# Patient Record
Sex: Female | Born: 1985
Health system: Southern US, Community
[De-identification: ages and names within clinical notes are randomized; demographics above are authoritative.]

## PROBLEM LIST (undated history)

## (undated) DIAGNOSIS — N390 Urinary tract infection, site not specified: Secondary | ICD-10-CM

## (undated) DIAGNOSIS — Z9889 Other specified postprocedural states: Secondary | ICD-10-CM

## (undated) DIAGNOSIS — U071 COVID-19: Secondary | ICD-10-CM

## (undated) DIAGNOSIS — A0472 Enterocolitis due to Clostridium difficile, not specified as recurrent: Secondary | ICD-10-CM

## (undated) DIAGNOSIS — W57XXXA Bitten or stung by nonvenomous insect and other nonvenomous arthropods, initial encounter: Secondary | ICD-10-CM

## (undated) DIAGNOSIS — M1990 Unspecified osteoarthritis, unspecified site: Secondary | ICD-10-CM

## (undated) DIAGNOSIS — T7840XA Allergy, unspecified, initial encounter: Secondary | ICD-10-CM

## (undated) DIAGNOSIS — J45909 Unspecified asthma, uncomplicated: Secondary | ICD-10-CM

## (undated) DIAGNOSIS — S70262A Insect bite (nonvenomous), left hip, initial encounter: Secondary | ICD-10-CM

## (undated) DIAGNOSIS — K219 Gastro-esophageal reflux disease without esophagitis: Secondary | ICD-10-CM

## (undated) HISTORY — PX: LEEP: SHX91

## (undated) HISTORY — PX: COLPOSCOPY: SHX161

## (undated) HISTORY — DX: Bitten or stung by nonvenomous insect and other nonvenomous arthropods, initial encounter: W57.XXXA

## (undated) HISTORY — DX: Urinary tract infection, site not specified: N39.0

## (undated) HISTORY — DX: Unspecified osteoarthritis, unspecified site: M19.90

## (undated) HISTORY — DX: Other specified postprocedural states: Z98.890

## (undated) HISTORY — DX: Enterocolitis due to Clostridium difficile, not specified as recurrent: A04.72

## (undated) HISTORY — DX: Gastro-esophageal reflux disease without esophagitis: K21.9

## (undated) HISTORY — PX: RETAINED PLACENTA REMOVAL: SHX2336

## (undated) HISTORY — PX: NO PAST SURGERIES: SHX2092

## (undated) HISTORY — DX: Allergy, unspecified, initial encounter: T78.40XA

## (undated) HISTORY — DX: Bitten or stung by nonvenomous insect and other nonvenomous arthropods, initial encounter: S70.262A

---

## 2011-12-23 ENCOUNTER — Ambulatory Visit: Payer: Self-pay

## 2013-09-22 ENCOUNTER — Inpatient Hospital Stay: Payer: Self-pay

## 2013-09-22 LAB — CBC WITH DIFFERENTIAL/PLATELET
Basophil %: 0.2 %
Eosinophil #: 0.1 10*3/uL (ref 0.0–0.7)
HCT: 33.7 % — ABNORMAL LOW (ref 35.0–47.0)
HGB: 11.5 g/dL — ABNORMAL LOW (ref 12.0–16.0)
Lymphocyte #: 1.5 10*3/uL (ref 1.0–3.6)
Lymphocyte %: 13.3 %
MCH: 28.7 pg (ref 26.0–34.0)
MCHC: 34.2 g/dL (ref 32.0–36.0)
MCV: 84 fL (ref 80–100)
Monocyte %: 6.8 %
Neutrophil #: 8.7 10*3/uL — ABNORMAL HIGH (ref 1.4–6.5)
Neutrophil %: 78.7 %
Platelet: 137 10*3/uL — ABNORMAL LOW (ref 150–440)
RDW: 14.2 % (ref 11.5–14.5)
WBC: 11 10*3/uL (ref 3.6–11.0)

## 2013-09-22 LAB — PLATELET COUNT: Platelet: 132 10*3/uL — ABNORMAL LOW (ref 150–440)

## 2013-11-12 ENCOUNTER — Emergency Department: Payer: Self-pay | Admitting: Emergency Medicine

## 2013-11-12 LAB — CBC
HCT: 36.8 %
HGB: 12.4 g/dL
MCH: 27.8 pg
MCHC: 33.7 g/dL
MCV: 83 fL
Platelet: 209 x10 3/mm 3
RBC: 4.45 X10 6/mm 3
RDW: 14 %
WBC: 8.8 x10 3/mm 3

## 2013-11-12 LAB — HCG, QUANTITATIVE, PREGNANCY: Beta Hcg, Quant.: 25 m[IU]/mL — ABNORMAL HIGH

## 2014-10-14 NOTE — L&D Delivery Note (Signed)
Delivery Note At 11:11 AM a viable female was delivered via Vaginal, Spontaneous Delivery (Presentation: compound; LOA followed by Left hand).  APGAR: 6, 8; weight 6 lb 14.1 oz (3120 g).   Placenta status: Intact, Spontaneous.  Cord: 3 vessels with the following complications: none.  Cord was very thick.  Cord pH: arterial: 7.29, PCO2 48, Bicarb; 23.1, BE: -3.8; Venous 7.38, PCO2: 40, Bicarb 23.7, BE- 1.3.  Anesthesia: Epidural  Episiotomy:  None  Lacerations:  clitoral Suture Repair: 3.0 vicryl Est. Blood Loss (mL):  400cc  Mom to postpartum.  Baby to Couplet care / Skin to Skin.  Carolyn Robles presented in labor at 5cm and quickly progressed. She received an epidural but had a predominant right-sided block.  She was AROM'd for clear fluid at 9cm.  Once complete she had a fairly short second stage with great maternal effort.  Baby Carolyn Robles was born and delayed cord clamping was performed due to prematurity.  For 60 seconds, the baby was held by me on the bed below the level of the placenta and maternal heart.  Bulb suction was performed to the oropharynx, and baby had good tone but remained blue.  At the 1 minute mark he began to cry.  He was placed on mom's chest and attended to by the pediatric team.  They requested the cord be cut and baby brought to the warmer for further attention.  Placenta was delivered with massage and intact - maternal surfaces and membranes were inspected carefully as patient had history of retained placenta.  The vagina was inspected and a hemrrhagic laceration at the anterior vaginal apex was identified.  Due to the extent of the bleeding, a repair was necessary.  A rubber catheter was placed in the urethra, and 3-0 Vicryl was used in a running stitch with care for depth and distance due to clitoral nerve ending preservation.  Hemostasis was acheived, and the patient was recovered.  During the repair, the newborn was having difficulty with respirations and it was decided to bring him  to the nursery for closer observation.   Upon completion of patient care, I went to the nursery to check on Carolyn Robles, who was at 93-97% O2 saturation without oxygen supplementation.  Because we were not able to sing in mom's room, I sang Happy Rudene Anda to little man in the nursery.  He clearly loved it.    ----- Larey Days, MD Attending Obstetrician and Gynecologist [facility]

## 2015-02-21 NOTE — H&P (Signed)
L&D Evaluation:  History:  HPI 29yo MWF presents with SROM of clear fluid at home at 2150 on 09/21/13; G1P0000, [redacted]w[redacted]d, irreg u/c's on admission.   Presents with leaking fluid   Patient's Medical History Asthma   Patient's Surgical History none   Medications Pre [place] Vitamins  Flonase; ProAir, xyzal   Allergies NKDA   Social History none   Family History Non-Contributory   ROS:  ROS All systems were reviewed.  HEENT, CNS, GI, GU, Respiratory, CV, Renal and Musculoskeletal systems were found to be normal.   Exam:  Vital Signs stable   Urine Protein 1+   General no apparent distress   Mental Status clear   Chest clear   Heart normal sinus rhythm   Abdomen gravid, tender with contractions   Estimated Fetal Weight Average for gestational age   Fetal Position vtx   Edema 1+   Reflexes 1+   Clonus negative   Pelvic 5/80/-1   Mebranes Ruptured   Description clear   FHT normal rate with no decels   Fetal Heart Rate 150   Ucx irregular   Ucx Frequency 6 min   Length of each Contraction 50 seconds   Ucx Pain Scale 6   Impression:  Impression active labor   Plan:  Plan monitor contractions and for cervical change, up ad lib   Electronic Signatures: Trudee Kuster, Izekiel Flegel N (CNM)  (Signed 10-Dec-14 08:13)  Authored: L&D Evaluation   Last Updated: 10-Dec-14 08:13 by Evonnie Pat (CNM)

## 2015-03-03 LAB — OB RESULTS CONSOLE HEPATITIS B SURFACE ANTIGEN: Hepatitis B Surface Ag: NEGATIVE

## 2015-03-03 LAB — OB RESULTS CONSOLE RUBELLA ANTIBODY, IGM: RUBELLA: IMMUNE

## 2015-03-03 LAB — OB RESULTS CONSOLE RPR: RPR: NONREACTIVE

## 2015-03-03 LAB — OB RESULTS CONSOLE ABO/RH: RH TYPE: POSITIVE

## 2015-03-03 LAB — OB RESULTS CONSOLE ANTIBODY SCREEN: Antibody Screen: NEGATIVE

## 2015-03-03 LAB — OB RESULTS CONSOLE VARICELLA ZOSTER ANTIBODY, IGG: Varicella: IMMUNE

## 2015-03-03 LAB — OB RESULTS CONSOLE HIV ANTIBODY (ROUTINE TESTING): HIV: NONREACTIVE

## 2015-10-07 ENCOUNTER — Inpatient Hospital Stay: Payer: Commercial Managed Care - HMO | Admitting: Anesthesiology

## 2015-10-07 ENCOUNTER — Inpatient Hospital Stay
Admission: EM | Admit: 2015-10-07 | Discharge: 2015-10-09 | DRG: 775 | Disposition: A | Discharge status: Home / Self Care | Payer: Commercial Managed Care - HMO | Attending: Obstetrics & Gynecology | Admitting: Obstetrics & Gynecology

## 2015-10-07 DIAGNOSIS — O9952 Diseases of the respiratory system complicating childbirth: Secondary | ICD-10-CM | POA: Diagnosis present

## 2015-10-07 DIAGNOSIS — J45909 Unspecified asthma, uncomplicated: Secondary | ICD-10-CM

## 2015-10-07 DIAGNOSIS — J4520 Mild intermittent asthma, uncomplicated: Secondary | ICD-10-CM | POA: Diagnosis present

## 2015-10-07 DIAGNOSIS — O99519 Diseases of the respiratory system complicating pregnancy, unspecified trimester: Secondary | ICD-10-CM

## 2015-10-07 DIAGNOSIS — Z3A35 35 weeks gestation of pregnancy: Secondary | ICD-10-CM

## 2015-10-07 DIAGNOSIS — O09299 Supervision of pregnancy with other poor reproductive or obstetric history, unspecified trimester: Secondary | ICD-10-CM

## 2015-10-07 DIAGNOSIS — O0993 Supervision of high risk pregnancy, unspecified, third trimester: Secondary | ICD-10-CM

## 2015-10-07 HISTORY — DX: Unspecified asthma, uncomplicated: J45.909

## 2015-10-07 LAB — CBC
HEMATOCRIT: 34.6 % — AB (ref 35.0–47.0)
HEMOGLOBIN: 11.8 g/dL — AB (ref 12.0–16.0)
MCH: 26.7 pg (ref 26.0–34.0)
MCHC: 34.2 g/dL (ref 32.0–36.0)
MCV: 78 fL — AB (ref 80.0–100.0)
Platelets: 154 10*3/uL (ref 150–440)
RBC: 4.44 MIL/uL (ref 3.80–5.20)
RDW: 13.9 % (ref 11.5–14.5)
WBC: 10 10*3/uL (ref 3.6–11.0)

## 2015-10-07 LAB — TYPE AND SCREEN
ABO/RH(D): A POS
Antibody Screen: NEGATIVE

## 2015-10-07 LAB — ABO/RH: ABO/RH(D): A POS

## 2015-10-07 MED ORDER — BUPIVACAINE HCL (PF) 0.25 % IJ SOLN
INTRAMUSCULAR | Status: DC | PRN
  Administered 2015-10-07: 5 mL via EPIDURAL

## 2015-10-07 MED ORDER — IBUPROFEN 600 MG PO TABS
600.0000 mg | ORAL_TABLET | Freq: Four times a day (QID) | ORAL | Status: DC
  Administered 2015-10-07: 600 mg via ORAL
  Filled 2015-10-07 (×2): qty 1

## 2015-10-07 MED ORDER — LANOLIN HYDROUS EX OINT: TOPICAL_OINTMENT | CUTANEOUS | Status: DC | PRN

## 2015-10-07 MED ORDER — OXYTOCIN 10 UNIT/ML IJ SOLN
INTRAMUSCULAR | Status: AC
  Filled 2015-10-07: qty 2

## 2015-10-07 MED ORDER — LIDOCAINE HCL (PF) 1 % IJ SOLN: 30.0000 mL | INTRAMUSCULAR | Status: DC | PRN

## 2015-10-07 MED ORDER — ACETAMINOPHEN 325 MG PO TABS: 650.0000 mg | ORAL_TABLET | ORAL | Status: DC | PRN

## 2015-10-07 MED ORDER — DIPHENHYDRAMINE HCL 25 MG PO CAPS: 25.0000 mg | ORAL_CAPSULE | Freq: Four times a day (QID) | ORAL | Status: DC | PRN

## 2015-10-07 MED ORDER — AMMONIA AROMATIC IN INHA
RESPIRATORY_TRACT | Status: AC
  Filled 2015-10-07: qty 10

## 2015-10-07 MED ORDER — FENTANYL 2.5 MCG/ML W/ROPIVACAINE 0.2% IN NS 100 ML EPIDURAL INFUSION (ARMC-ANES)
EPIDURAL | Status: AC
  Administered 2015-10-07: 12 mL/h via EPIDURAL
  Filled 2015-10-07: qty 100

## 2015-10-07 MED ORDER — WITCH HAZEL-GLYCERIN EX PADS: 1.0000 "application " | MEDICATED_PAD | CUTANEOUS | Status: DC | PRN

## 2015-10-07 MED ORDER — TERBUTALINE SULFATE 1 MG/ML IJ SOLN
INTRAMUSCULAR | Status: AC
Start: 2015-10-07 — End: 2015-10-07
  Filled 2015-10-07: qty 1

## 2015-10-07 MED ORDER — MISOPROSTOL 200 MCG PO TABS
ORAL_TABLET | ORAL | Status: AC
  Filled 2015-10-07: qty 4

## 2015-10-07 MED ORDER — AMPICILLIN SODIUM 2 G IJ SOLR
INTRAMUSCULAR | Status: AC
  Administered 2015-10-07: 2 g via INTRAVENOUS
  Filled 2015-10-07: qty 2000

## 2015-10-07 MED ORDER — ONDANSETRON HCL 4 MG PO TABS
4.0000 mg | ORAL_TABLET | ORAL | Status: DC | PRN
Start: 2015-10-07 — End: 2015-10-09

## 2015-10-07 MED ORDER — HYDROCORTISONE ACE-PRAMOXINE 1-1 % RE FOAM
1.0000 | Freq: Two times a day (BID) | RECTAL | Status: DC
  Administered 2015-10-08: 1 via RECTAL
  Filled 2015-10-07: qty 10

## 2015-10-07 MED ORDER — EPHEDRINE 5 MG/ML INJ: 10.0000 mg | INTRAVENOUS | Status: DC | PRN

## 2015-10-07 MED ORDER — BENZOCAINE-MENTHOL 20-0.5 % EX AERO
1.0000 "application " | INHALATION_SPRAY | CUTANEOUS | Status: DC | PRN
  Filled 2015-10-07: qty 56

## 2015-10-07 MED ORDER — FENTANYL 2.5 MCG/ML W/ROPIVACAINE 0.2% IN NS 100 ML EPIDURAL INFUSION (ARMC-ANES): 12.0000 mL/h | EPIDURAL | Status: DC

## 2015-10-07 MED ORDER — ONDANSETRON HCL 4 MG/2ML IJ SOLN: 4.0000 mg | Freq: Four times a day (QID) | INTRAMUSCULAR | Status: DC | PRN

## 2015-10-07 MED ORDER — OXYTOCIN 40 UNITS IN LACTATED RINGERS INFUSION - SIMPLE MED
62.5000 mL/h | INTRAVENOUS | Status: DC
  Administered 2015-10-07: 62.5 mL/h via INTRAVENOUS

## 2015-10-07 MED ORDER — LIDOCAINE HCL (PF) 1 % IJ SOLN
INTRAMUSCULAR | Status: AC
  Administered 2015-10-07: 2 mL
  Filled 2015-10-07: qty 30

## 2015-10-07 MED ORDER — TETANUS-DIPHTH-ACELL PERTUSSIS 5-2.5-18.5 LF-MCG/0.5 IM SUSP
0.5000 mL | Freq: Once | INTRAMUSCULAR | Status: DC
Start: 2015-10-08 — End: 2015-10-09

## 2015-10-07 MED ORDER — SODIUM CHLORIDE 0.9 % IV SOLN
2.0000 g | Freq: Once | INTRAVENOUS | Status: AC
  Administered 2015-10-07: 2 g via INTRAVENOUS

## 2015-10-07 MED ORDER — BETAMETHASONE SOD PHOS & ACET 6 (3-3) MG/ML IJ SUSP
INTRAMUSCULAR | Status: AC
  Filled 2015-10-07: qty 1

## 2015-10-07 MED ORDER — PHENYLEPHRINE 40 MCG/ML (10ML) SYRINGE FOR IV PUSH (FOR BLOOD PRESSURE SUPPORT): 80.0000 ug | PREFILLED_SYRINGE | INTRAVENOUS | Status: DC | PRN

## 2015-10-07 MED ORDER — SIMETHICONE 80 MG PO CHEW: 80.0000 mg | CHEWABLE_TABLET | ORAL | Status: DC | PRN

## 2015-10-07 MED ORDER — DIPHENHYDRAMINE HCL 50 MG/ML IJ SOLN: 12.5000 mg | INTRAMUSCULAR | Status: DC | PRN

## 2015-10-07 MED ORDER — DIBUCAINE 1 % RE OINT: 1.0000 "application " | TOPICAL_OINTMENT | RECTAL | Status: DC | PRN

## 2015-10-07 MED ORDER — LACTATED RINGERS IV SOLN
INTRAVENOUS | Status: DC
  Administered 2015-10-07: 09:00:00 via INTRAVENOUS

## 2015-10-07 MED ORDER — LACTATED RINGERS IV SOLN: 500.0000 mL | INTRAVENOUS | Status: DC | PRN

## 2015-10-07 MED ORDER — OXYCODONE-ACETAMINOPHEN 5-325 MG PO TABS
1.0000 | ORAL_TABLET | ORAL | Status: DC | PRN
  Filled 2015-10-07: qty 1

## 2015-10-07 MED ORDER — ONDANSETRON HCL 4 MG/2ML IJ SOLN: 4.0000 mg | INTRAMUSCULAR | Status: DC | PRN

## 2015-10-07 MED ORDER — OXYTOCIN 40 UNITS IN LACTATED RINGERS INFUSION - SIMPLE MED
INTRAVENOUS | Status: AC
  Administered 2015-10-07: 62.5 mL/h via INTRAVENOUS
  Filled 2015-10-07: qty 1000

## 2015-10-07 MED ORDER — PRENATAL MULTIVITAMIN CH
1.0000 | ORAL_TABLET | Freq: Every day | ORAL | Status: DC
  Administered 2015-10-08: 1 via ORAL
  Filled 2015-10-07: qty 1

## 2015-10-07 MED ORDER — OXYTOCIN BOLUS FROM INFUSION
500.0000 mL | INTRAVENOUS | Status: DC
  Administered 2015-10-07: 500 mL via INTRAVENOUS

## 2015-10-07 MED ORDER — LIDOCAINE-EPINEPHRINE (PF) 1.5 %-1:200000 IJ SOLN
INTRAMUSCULAR | Status: DC | PRN
  Administered 2015-10-07: 2 mL via EPIDURAL

## 2015-10-07 MED ORDER — OXYTOCIN 40 UNITS IN LACTATED RINGERS INFUSION - SIMPLE MED
62.5000 mL/h | INTRAVENOUS | Status: DC | PRN
  Filled 2015-10-07: qty 1000

## 2015-10-07 MED ORDER — DOCUSATE SODIUM 100 MG PO CAPS
100.0000 mg | ORAL_CAPSULE | Freq: Two times a day (BID) | ORAL | Status: DC
  Administered 2015-10-07: 100 mg via ORAL
  Filled 2015-10-07 (×2): qty 1

## 2015-10-07 NOTE — Anesthesia Procedure Notes (Signed)
Epidural Patient location during procedure: OB Start time: 10/07/2015 10:06 AM End time: 10/07/2015 10:20 AM  Staffing Anesthesiologist: Marline Backbone F Performed by: anesthesiologist   Preanesthetic Checklist Completed: patient identified, site marked, surgical consent, pre-op evaluation, timeout performed, IV checked, risks and benefits discussed, monitors and equipment checked and at surgeon's request  Epidural Patient position: sitting Prep: Betadine Patient monitoring: heart rate and blood pressure Approach: midline Location: L3-L4 Injection technique: LOR air and LOR saline  Needle:  Needle type: Tuohy  Needle gauge: 18 G Needle length: 9 cm Needle insertion depth: 5 cm Catheter type: closed end flexible Catheter size: 20 Guage Catheter at skin depth: 10 cm Test dose: negative and 1.5% lidocaine with Epi 1:200 K  Assessment Sensory level: T8  Additional Notes Reason for block:at surgeon's request

## 2015-10-07 NOTE — H&P (Signed)
OB History & Physical   History of Present Illness:  Chief Complaint:   HPI:  Carolyn Robles is a 29 y.o. G2P1001 female at [redacted]w[redacted]d dated by LMP c/w 1st trimester scan with EDC of 11/05/15.  She presents to L&D for persistent contractions overnight.  +FM, + CTX, no LOF, no VB  Pregnancy Issues: 1. History of pre-eclampsia 2. Asthma, well-controlled  Maternal Medical History:   Past Medical History  Diagnosis Date  . Asthma     seasonal    Past Surgical History  Procedure Laterality Date  . No past surgeries      No Known Allergies  Prior to Admission medications   Medication Sig Start Date End Date Taking? Authorizing Provider  albuterol (PROVENTIL HFA;VENTOLIN HFA) 108 (90 BASE) MCG/ACT inhaler Inhale into the lungs every 6 (six) hours as needed for wheezing or shortness of breath.   Yes Historical Provider, MD  levocetirizine (XYZAL) 5 MG tablet Take 5 mg by mouth every evening.   Yes Historical Provider, MD  Prenatal Vit-Fe Fumarate-FA (PRENATAL MULTIVITAMIN) TABS tablet Take 1 tablet by mouth daily at 12 noon.   Yes Historical Provider, MD     Prenatal care site: [facility]   Social History: She  reports that she has never smoked. She has never used smokeless tobacco. She reports that she does not drink alcohol or use illicit drugs.  Family History: family history includes Diabetes in her father; Heart disease in her mother.   Review of Systems: Negative x 10 systems reviewed except as noted in the HPI.     Physical Exam:  Vital Signs: BP 134/78 mmHg  Pulse 90  Temp(Src) 98 F (36.7 C) (Oral)  Resp 14  Ht 5\' 6"  (1.676 m)  Wt 94.348 kg (208 lb)  BMI 33.59 kg/m2  SpO2 99% General: no acute distress.  HEENT: normocephalic, atraumatic Heart: regular rate & rhythm.  No murmurs/rubs/gallops Lungs: clear to auscultation bilaterally, normal respiratory effort Abdomen: soft, gravid, non-tender;  EFW: 7.0lbs Pelvic:   External: Normal external female  genitalia  Cervix: Dilation: 5 / Effacement (%): 80 / Station: -1    Extremities: non-tender, symmetric, 2+ edema bilaterally.  DTRs: 2+ Neurologic: Alert & oriented x 3.    Results for orders placed or performed during the hospital encounter of 10/07/15 (from the past 24 hour(s))  CBC     Status: Abnormal   Collection Time: 10/07/15  9:17 AM  Result Value Ref Range   WBC 10.0 3.6 - 11.0 K/uL   RBC 4.44 3.80 - 5.20 MIL/uL   Hemoglobin 11.8 (L) 12.0 - 16.0 g/dL   HCT 34.6 (L) 35.0 - 47.0 %   MCV 78.0 (L) 80.0 - 100.0 fL   MCH 26.7 26.0 - 34.0 pg   MCHC 34.2 32.0 - 36.0 g/dL   RDW 13.9 11.5 - 14.5 %   Platelets 154 150 - 440 K/uL  Type and screen     Status: None   Collection Time: 10/07/15  9:17 AM  Result Value Ref Range   ABO/RH(D) A POS    Antibody Screen NEG    Sample Expiration 10/10/2015   ABO/Rh     Status: None   Collection Time: 10/07/15  9:21 AM  Result Value Ref Range   ABO/RH(D) A POS     Pertinent Results:  Prenatal Labs: Blood type/Rh A+  Antibody screen neg  Rubella Immune  Varicella Immune  RPR NR  HBsAg Neg  HIV NR  GC neg  Chlamydia neg  Genetic screening negative  1 hour GTT 124  3 hour GTT n/a  GBS Not done  S/p flu and tdap   FHT: 150 mod + accels no decels TOCO: q2-3 mins SVE: [id number]   Cephalic by leopolds  Assessment:  Carolyn Robles is a 30 y.o. G64P1001 female at [redacted]w[redacted]d with preterm labor.   Plan:  1. Admit to Labor & Delivery 2. CBC, T&S, Clrs, IVF 3. GBS not done, ampicillin 2g q4h 4. Consents obtained. 5. Continuous efm/toco 6. Offered betamethasone and tocolytics to patient with explanation that this has recently shown to be of benefit for preterm up to 36.[redacted] weeks gestation; discussed the administration and course, and patient declined intervention.   7. Epidural after platelets return 8. Anticipate vaginal and imminent delivery.  ----- Larey Days, MD Attending Obstetrician and Gynecologist [facility]

## 2015-10-07 NOTE — Anesthesia Preprocedure Evaluation (Signed)
Anesthesia Evaluation  Patient identified by MRN, date of birth, ID band Patient awake    Reviewed: Allergy & Precautions, NPO status , Patient's Chart, lab work & pertinent test results  Airway Mallampati: I       Dental no notable dental hx.    Pulmonary neg pulmonary ROS,    Pulmonary exam normal        Cardiovascular negative cardio ROS   Rhythm:Regular     Neuro/Psych    GI/Hepatic negative GI ROS, Neg liver ROS,   Endo/Other  negative endocrine ROS  Renal/GU negative Renal ROS     Musculoskeletal negative musculoskeletal ROS (+)   Abdominal Normal abdominal exam  (+)   Peds negative pediatric ROS (+)  Hematology negative hematology ROS (+)   Anesthesia Other Findings   Reproductive/Obstetrics                             Anesthesia Physical Anesthesia Plan  ASA: II  Anesthesia Plan: Epidural   Post-op Pain Management:    Induction:   Airway Management Planned:   Additional Equipment:   Intra-op Plan:   Post-operative Plan:   Informed Consent: I have reviewed the patients History and Physical, chart, labs and discussed the procedure including the risks, benefits and alternatives for the proposed anesthesia with the patient or authorized representative who has indicated his/her understanding and acceptance.     Plan Discussed with:   Anesthesia Plan Comments:         Anesthesia Quick Evaluation

## 2015-10-07 NOTE — Discharge Summary (Signed)
Obstetrical Discharge Summary  Patient Name: Carolyn Robles DOB: 08/01/1986 MRN: [id number]  Date of Admission: 10/07/2015 Date of Discharge: 10/09/2015 Primary OB: [facility]   Gestational Age at Delivery: [redacted]w[redacted]d   Antepartum complications: Asthma, history of preelcampsia Admitting Diagnosis: preterm labor Secondary Diagnosis: Patient Active Problem List   Diagnosis Date Noted  . Postpartum care following vaginal delivery 10/07/2015    Augmentation: none Complications: None Intrapartum complications/course: Carolyn Robles presented in labor at 5cm and quickly progressed.  Ampicillin was given for GBS PPX. She received an epidural but had a predominant right-sided block. She was AROM'd for clear fluid at 9cm. Once complete she had a fairly short second stage with great maternal effort. Baby Carolyn Robles was born and delayed cord clamping was performed due to prematurity. For 60 seconds, the baby was held by me on the bed below the level of the placenta and maternal heart. Bulb suction was performed to the oropharynx, and baby had good tone but remained blue. At the 1 minute mark he began to cry. He was placed on mom's chest and attended to by the pediatric team. They requested the cord be cut and baby brought to the warmer for further attention. Placenta was delivered with massage and intact - maternal surfaces and membranes were inspected carefully as patient had history of retained placenta. The vagina was inspected and a hemrrhagic laceration at the anterior vaginal apex was identified. Due to the extent of the bleeding, a repair was necessary. A rubber catheter was placed in the urethra, and 3-0 Vicryl was used in a running stitch with care for depth and distance due to clitoral nerve ending preservation. Hemostasis was acheived, and the patient was recovered. During the repair, the newborn was having difficulty with respirations and it was decided to bring him to the nursery for closer  observation. Upon completion of patient care, I went to the nursery to check on Carolyn Robles, who was at 93-97% O2 saturation without oxygen supplementation. Because we were not able to sing in mom's room, I sang Happy Birthday to little man in the nursery. He clearly loved it.  Date of Delivery: 10/07/15 Delivered By: Carolyn Robles Delivery Type: spontaneous vaginal delivery Anesthesia: epidural Placenta: sponatneous Laceration: clitoral Episiotomy: none Newborn Data: Live born female /Carolyn Robles Birth Weight: 6 lb 14.1 oz (3120 g) APGAR: 6, 8    Discharge Physical Exam: 10/09/2015  BP 113/75 mmHg  Pulse 65  Temp(Src) 98 F (36.7 C) (Oral)  Resp 18  Ht 5' 6" (1.676 m)  Wt 94.348 kg (208 lb)  BMI 33.59 kg/m2  SpO2 98%  General: NAD ABD: s/nd/nt, fundus firm and 2FB below the umbilicus Lochia: small DVT Evaluation: LE non-ttp, no evidence of DVT on exam.  HEMOGLOBIN  Date Value Ref Range Status  10/08/2015 10.4* 12.0 - 16.0 g/dL Final   HGB  Date Value Ref Range Status  11/12/2013 12.4 12.0-16.0 g/dL Final   HCT  Date Value Ref Range Status  10/08/2015 30.4* 35.0 - 47.0 % Final  11/12/2013 36.8 35.0-47.0 % Final    Post partum course: Postpartum course was uncomplicated except for mild asymptomatic anemia. Patient was discharged on PPD #2 having met discharge goals: tolerating regular diet, voiding without difficulties, afebrile,having appropriate lochia  and ambulating without assistance. Carolyn Robles was breastfeeding and caring for baby with minimal assistance Postpartum Procedures:none Disposition: stable, discharge to home. Baby Feeding: breastmilk Baby Disposition: home with mom  Rh Immune globulin given: n/a Rubella vaccine given: n/a Tdap vaccine given in   AP or PP setting: antepartum Flu vaccine given in AP or PP setting: antepartum  Contraception: minipill  Prenatal Labs:  Blood type/Rh A+  Antibody screen neg  Rubella Immune  Varicella Immune  RPR  NR  HBsAg Neg  HIV NR  GC neg  Chlamydia neg  Genetic screening negative  1 hour GTT 124  3 hour GTT n/a  GBS Not done  S/p flu and tdap        Plan:  Carolyn Robles was discharged to home in good condition. Follow-up appointment at [facility] with Dr Leonides Schanz in 6 weeks   Discharge Medications:   Medication List    TAKE these medications        albuterol 108 (90 BASE) MCG/ACT inhaler  Commonly known as:  PROVENTIL HFA;VENTOLIN HFA  Inhale into the lungs every 6 (six) hours as needed for wheezing or shortness of breath.     benzocaine-Menthol 20-0.5 % Aero  Commonly known as:  DERMOPLAST  Apply 1 application topically as needed for irritation (perineal discomfort).     docusate sodium 100 MG capsule  Commonly known as:  COLACE  Take 1 capsule (100 mg total) by mouth 2 (two) times daily as needed for mild constipation.     ibuprofen 600 MG tablet  Commonly known as:  ADVIL,MOTRIN  Take 1 tablet (600 mg total) by mouth every 6 (six) hours as needed.     levocetirizine 5 MG tablet  Commonly known as:  XYZAL  Take 5 mg by mouth every evening.     norethindrone 0.35 MG tablet  Commonly known as:  MICRONOR,CAMILA,ERRIN  Take 1 tablet (0.35 mg total) by mouth daily.  Start taking on:  11/03/2016     oxyCODONE-acetaminophen 5-325 MG tablet  Commonly known as:  PERCOCET/ROXICET  Take 1 tablet by mouth every 6 (six) hours as needed (for pain scale 4-7).     prenatal multivitamin Tabs tablet  Take 1 tablet by mouth daily at 12 noon.        Follow-up Information    Follow up with Robles, Honor Loh, MD In 6 weeks.   Specialty:  Obstetrics and Gynecology   Why:  for routin post partum follow-up   Contact information:   [place] [address] [phone]       Signed: Dalia Robles, CNM

## 2015-10-08 LAB — CBC
HCT: 30.4 % — ABNORMAL LOW (ref 35.0–47.0)
Hemoglobin: 10.4 g/dL — ABNORMAL LOW (ref 12.0–16.0)
MCH: 26.6 pg (ref 26.0–34.0)
MCHC: 34.1 g/dL (ref 32.0–36.0)
MCV: 78 fL — AB (ref 80.0–100.0)
PLATELETS: 145 10*3/uL — AB (ref 150–440)
RBC: 3.9 MIL/uL (ref 3.80–5.20)
RDW: 14 % (ref 11.5–14.5)
WBC: 9.8 10*3/uL (ref 3.6–11.0)

## 2015-10-08 LAB — RPR: RPR: NONREACTIVE

## 2015-10-08 MED ORDER — DOCUSATE SODIUM 100 MG PO CAPS: 100.0000 mg | ORAL_CAPSULE | Freq: Two times a day (BID) | ORAL | Status: DC | PRN

## 2015-10-08 MED ORDER — OXYCODONE-ACETAMINOPHEN 5-325 MG PO TABS: 1.0000 | ORAL_TABLET | Freq: Four times a day (QID) | ORAL | Status: DC | PRN

## 2015-10-08 MED ORDER — IBUPROFEN 600 MG PO TABS: 600.0000 mg | ORAL_TABLET | Freq: Four times a day (QID) | ORAL | Status: DC | PRN

## 2015-10-08 NOTE — Anesthesia Postprocedure Evaluation (Signed)
Anesthesia Post Note  Patient: Carolyn Robles  Procedure(s) Performed: * No procedures listed *  Patient location during evaluation: Mother Baby Anesthesia Type: Epidural Level of consciousness: awake, awake and alert and oriented Pain management: pain level controlled Vital Signs Assessment: post-procedure vital signs reviewed and stable Respiratory status: spontaneous breathing and nonlabored ventilation Cardiovascular status: blood pressure returned to baseline Postop Assessment: no headache, patient able to bend at knees, no signs of nausea or vomiting and adequate PO intake Anesthetic complications: no    Last Vitals:  Filed Vitals:   10/08/15 0741 10/08/15 1123  BP: 119/64 122/84  Pulse: 67 78  Temp: 36.8 C 36.7 C  Resp: 18 18    Last Pain:  Filed Vitals:   10/08/15 1123  PainSc: 0-No pain                 Aviah Sorci,  Claudine Stallings R

## 2015-10-08 NOTE — Progress Notes (Addendum)
Daily Post Partum Note  Carolyn Robles is a 29 y.o. [id number] PPD#1 s/p  SVD/clitoral laceration (repaired)  @ 35/6  Pregnancy c/b mild intermittent asthma, h/o pre-eclampsia  24hr/overnight events:  none  Subjective:  Pt doing well, with decreasing lochia. No issues with voiding  Objective:    Current Vital Signs 24h Vital Sign Ranges  T 98.2 F (36.8 C) Temp  Avg: 98.5 F (36.9 C)  Min: 98 F (36.7 C)  Max: 99.3 F (37.4 C)  BP 119/64 mmHg BP  Min: 79/60  Max: 151/84  HR 67 Pulse  Avg: 82.8  Min: 67  Max: 103  RR 18 Resp  Avg: 15.7  Min: 14  Max: 20  SaO2 97 % Not Delivered SpO2  Avg: 98 %  Min: 97 %  Max: 99 %       24 Hour I/O Current Shift I/O  Time Ins Outs 12/24 0701 - 12/25 0700 In: 1047.5 [P.O.:360; I.V.:687.5] Out: 1150 [Urine:1150]      General: NAD Abdomen: NTTP FF below the umbilicus Perineum: deferred Skin:  Warm and dry.  Respiratory:   Normal respiratory effort  Medications Current Facility-Administered Medications  Medication Dose Route Frequency Provider Last Rate Last Dose  . acetaminophen (TYLENOL) tablet 650 mg  650 mg Oral Q4H PRN Chelsea C Ward, MD      . benzocaine-Menthol (DERMOPLAST) 20-0.5 % topical spray 1 application  1 application Topical PRN Chelsea C Ward, MD      . witch hazel-glycerin (TUCKS) pad 1 application  1 application Topical PRN Chelsea C Ward, MD       And  . dibucaine (NUPERCAINAL) 1 % rectal ointment 1 application  1 application Rectal PRN Chelsea C Ward, MD      . diphenhydrAMINE (BENADRYL) capsule 25 mg  25 mg Oral Q6H PRN Chelsea C Ward, MD      . diphenhydrAMINE (BENADRYL) injection 12.5 mg  12.5 mg Intravenous Q15 min PRN Gijsbertus F Boston Service, MD      . docusate sodium (COLACE) capsule 100 mg  100 mg Oral BID Honor Loh Ward, MD   100 mg at 10/07/15 2009  . hydrocortisone-pramoxine (PROCTOFOAM-HC) rectal foam 1 applicator  1 applicator Rectal BID Chelsea C Ward, MD      . ibuprofen (ADVIL,MOTRIN) tablet 600 mg  600  mg Oral 4 times per day Maceo Pro, MD   600 mg at 10/07/15 2009  . lanolin ointment   Topical PRN Chelsea C Ward, MD      . ondansetron (ZOFRAN) injection 4 mg  4 mg Intravenous Q6H PRN Chelsea C Ward, MD      . ondansetron (ZOFRAN) tablet 4 mg  4 mg Oral Q4H PRN Chelsea C Ward, MD       Or  . ondansetron (ZOFRAN) injection 4 mg  4 mg Intravenous Q4H PRN Chelsea C Ward, MD      . oxyCODONE-acetaminophen (PERCOCET/ROXICET) 5-325 MG per tablet 1 tablet  1 tablet Oral Q4H PRN Chelsea C Ward, MD      . oxytocin (PITOCIN) IV BOLUS FROM BAG  500 mL Intravenous Continuous Honor Loh Ward, MD 999 mL/hr at 10/07/15 1120 500 mL at 10/07/15 1120  . oxytocin (PITOCIN) IV infusion 40 units in LR 1000 mL  62.5 mL/hr Intravenous Continuous Chelsea C Ward, MD 62.5 mL/hr at 10/07/15 1200 62.5 mL/hr at 10/07/15 1200  . oxytocin (PITOCIN) IV infusion 40 units in LR 1000 mL  62.5 mL/hr Intravenous Continuous PRN  Chelsea C Ward, MD      . prenatal multivitamin tablet 1 tablet  1 tablet Oral Q1200 Chelsea C Ward, MD      . simethicone [facility]) chewable tablet 80 mg  80 mg Oral PRN Chelsea C Ward, MD      . Tdap (BOOSTRIX) injection 0.5 mL  0.5 mL Intramuscular Once Honor Loh Ward, MD       Facility-Administered Medications Ordered in Other Encounters  Medication Dose Route Frequency Provider Last Rate Last Dose  . bupivacaine (PF) (MARCAINE) 0.25 % injection    Anesthesia Intra-op Gijsbertus Lonia Mad, MD   5 mL at 10/07/15 1011  . lidocaine-EPINEPHrine 1.5 %-1:200000 injection    Anesthesia Intra-op Gijsbertus Lonia Mad, MD   2 mL at 10/07/15 1014    Labs:   Recent Labs Lab 10/07/15 0917 10/08/15 0633  WBC 10.0 9.8  HGB 11.8* 10.4*  HCT 34.6* 30.4*  PLT 154 145*   No results for input(s): NA, K, CL, CO2, BUN, CREATININE, LABGLOM, GLUCOSE, CALCIUM in the last 168 hours.  Assessment & Plan:  Pt doing well *Postpartum/postop: routine care *Dispo: pt would like early discharge. i told her  that if infant can go, which is unlikely, then she can as well or if she'd like to go home even if infant has to stay, that's fine too.    Durene Romans MD [facility] Pager [phone]

## 2015-10-09 MED ORDER — NORETHINDRONE 0.35 MG PO TABS: 1.0000 | ORAL_TABLET | Freq: Every day | ORAL | Status: DC

## 2015-10-09 MED ORDER — BENZOCAINE-MENTHOL 20-0.5 % EX AERO: 1.0000 "application " | INHALATION_SPRAY | CUTANEOUS | Status: DC | PRN

## 2015-10-09 NOTE — Progress Notes (Signed)
Patient discharged home with infant. Vital signs stable, bleeding within normal limits, uterus firm. Discharge instructions, prescriptions, and follow up appointment given to and reviewed with patient. Patient verbalized understanding, all questions answered. Escorted in wheelchair by nursing.    

## 2015-10-09 NOTE — Discharge Instructions (Signed)
Discharge instructions:   Call office if you have any of the following: severe headache, vision changes (blurred vision, seeing spots), fever >100.4 F, chills, breast concerns, excessive vaginal bleeding, leg pain or redness, depression or any other concerns.   Activity: Do not lift > 10 lbs for 6 weeks.  No intercourse or tampons for 6 weeks.  No driving for 1-2 weeks.   Call your doctor for increased pain or vaginal bleeding, temperature above 100.4, depression, or concerns.  No strenuous activity or heavy lifting for 6 weeks.  No intercourse, tampons, douching, or enemas for 6 weeks.  No tub baths-showers only.  No driving for 2 weeks or while taking pain medications.  Continue prenatal vitamin and iron.  Increase calories and fluids while breastfeeding.  For concerns about your baby, please call your pediatrician For breastfeeding concerns, the lactation consultant can be reached at [phone]

## 2015-11-05 ENCOUNTER — Inpatient Hospital Stay: Admission: RE | Admit: 2015-11-05 | Payer: Commercial Managed Care - HMO

## 2016-01-03 ENCOUNTER — Other Ambulatory Visit: Payer: Self-pay | Admitting: Neurology

## 2016-01-03 DIAGNOSIS — O9089 Other complications of the puerperium, not elsewhere classified: Secondary | ICD-10-CM

## 2016-01-03 DIAGNOSIS — R51 Headache: Principal | ICD-10-CM

## 2016-01-03 DIAGNOSIS — R519 Headache, unspecified: Secondary | ICD-10-CM

## 2016-01-22 ENCOUNTER — Ambulatory Visit: Payer: Self-pay

## 2016-08-11 ENCOUNTER — Encounter (HOSPITAL_COMMUNITY): Payer: Self-pay

## 2017-01-06 ENCOUNTER — Other Ambulatory Visit: Payer: Self-pay | Admitting: Obstetrics and Gynecology

## 2017-10-20 ENCOUNTER — Telehealth: Payer: Self-pay | Admitting: *Deleted

## 2017-10-20 NOTE — Telephone Encounter (Signed)
Patient came into office and signed a medical release form and states she needs her list of her vaccinations. Patient needs it for work. Patient states she needs it before tomorrow. Patient states she will come to pick it up. Her contact # is [phone]. Please advise. Thank you

## 2017-10-20 NOTE — Telephone Encounter (Signed)
Pt aware tdap and flu given in 2014. Printed from old sys and left up front for p/u.

## 2017-12-31 ENCOUNTER — Encounter: Payer: Self-pay | Admitting: *Deleted

## 2018-01-21 ENCOUNTER — Encounter: Payer: Self-pay | Admitting: Obstetrics and Gynecology

## 2018-01-21 ENCOUNTER — Ambulatory Visit (INDEPENDENT_AMBULATORY_CARE_PROVIDER_SITE_OTHER): Payer: 59 | Admitting: Obstetrics and Gynecology

## 2018-01-21 ENCOUNTER — Other Ambulatory Visit: Payer: Self-pay | Admitting: Obstetrics and Gynecology

## 2018-01-21 VITALS — BP 115/68 | HR 64 | Ht 66.0 in | Wt 177.1 lb

## 2018-01-21 DIAGNOSIS — J302 Other seasonal allergic rhinitis: Secondary | ICD-10-CM

## 2018-01-21 DIAGNOSIS — Z30431 Encounter for routine checking of intrauterine contraceptive device: Secondary | ICD-10-CM | POA: Diagnosis not present

## 2018-01-21 DIAGNOSIS — L708 Other acne: Secondary | ICD-10-CM | POA: Diagnosis not present

## 2018-01-21 DIAGNOSIS — E663 Overweight: Secondary | ICD-10-CM | POA: Diagnosis not present

## 2018-01-21 DIAGNOSIS — L309 Dermatitis, unspecified: Secondary | ICD-10-CM

## 2018-01-21 DIAGNOSIS — Z01419 Encounter for gynecological examination (general) (routine) without abnormal findings: Secondary | ICD-10-CM | POA: Diagnosis not present

## 2018-01-21 MED ORDER — FLUTICASONE PROPIONATE 50 MCG/ACT NA SUSP: 2.0000 | Freq: Every day | NASAL | 6 refills | Status: DC

## 2018-01-21 MED ORDER — ALBUTEROL SULFATE HFA 108 (90 BASE) MCG/ACT IN AERS: 2.0000 | INHALATION_SPRAY | Freq: Four times a day (QID) | RESPIRATORY_TRACT | 9 refills | Status: DC | PRN

## 2018-01-21 MED ORDER — TRIAMCINOLONE ACETONIDE 0.1 % EX OINT: 1.0000 "application " | TOPICAL_OINTMENT | Freq: Two times a day (BID) | CUTANEOUS | 6 refills | Status: DC

## 2018-01-21 MED ORDER — LEVOCETIRIZINE DIHYDROCHLORIDE 5 MG PO TABS: 5.0000 mg | ORAL_TABLET | Freq: Every evening | ORAL | 12 refills | Status: DC

## 2018-01-21 MED ORDER — CYANOCOBALAMIN 1000 MCG/ML IJ SOLN: 1000.0000 ug | Freq: Once | INTRAMUSCULAR | 1 refills | Status: AC

## 2018-01-21 NOTE — Progress Notes (Signed)
Subjective:   Carolyn Robles is a 32 y.o. G13P1001 Caucasian female here for a routine well-woman exam.  Patient's last menstrual period was 12/25/2017.    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  Past Medical History Past Medical History:  Diagnosis Date  . Asthma    seasonal    Past Surgical History Past Surgical History:  Procedure Laterality Date  . NO PAST SURGERIES      Gynecologic History G2P1001  Patient's last menstrual period was 12/25/2017. Contraception: IUD Last Pap: ?Marland Kitchen Results were: normal   Obstetric History OB History  Gravida Para Term Preterm AB Living  2 1 1  0 0 1  SAB TAB Ectopic Multiple Live Births  0 0 0 0 1    # Outcome Date GA Lbr Len/2nd Weight Sex Delivery Anes PTL Lv  2 Gravida           1 Term 09/22/13 [redacted]w[redacted]d  6 lb 6 oz (2.892 kg) F Vag-Spont EPI N LIV    Current Medications Current Outpatient Medications on File Prior to Visit  Medication Sig Dispense Refill  . albuterol (PROVENTIL HFA;VENTOLIN HFA) 108 (90 BASE) MCG/ACT inhaler Inhale into the lungs every 6 (six) hours as needed for wheezing or shortness of breath.    . fluticasone (FLONASE) 50 MCG/ACT nasal spray Place into both nostrils daily.    Marland Kitchen levocetirizine (XYZAL) 5 MG tablet Take 5 mg by mouth every evening.    Marland Kitchen PARAGARD INTRAUTERINE COPPER IU by Intrauterine route.    . triamcinolone ointment (KENALOG) 0.1 % Apply 1 application topically 2 (two) times daily.    . benzocaine-Menthol (DERMOPLAST) 20-0.5 % AERO Apply 1  application topically as needed for irritation (perineal discomfort). (Patient not taking: Reported on 01/21/2018)    . ibuprofen (ADVIL,MOTRIN) 600 MG tablet Take 1 tablet (600 mg total) by mouth every 6 (six) hours as needed. (Patient not taking: Reported on 01/21/2018) 30 tablet 1  . oxyCODONE-acetaminophen (PERCOCET/ROXICET) 5-325 MG tablet Take 1 tablet by mouth every 6 (six) hours as needed (for pain scale 4-7). (Patient not taking: Reported on 01/21/2018) 10 tablet 0  . Prenatal Vit-Fe Fumarate-FA (PRENATAL MULTIVITAMIN) TABS tablet Take 1 tablet by mouth daily at 12 noon.     No current facility-administered medications on file prior to visit.     Review of Systems Patient denies any headaches, blurred vision, shortness of breath, chest pain, abdominal pain, problems with bowel movements, urination, or intercourse.  Objective:  BP 115/68   Pulse 64   Ht 5\' 6"  (1.676 m)   Wt 177 lb 1.6 oz (80.3 kg)   LMP 12/25/2017   BMI 28.58 kg/m  Physical Exam  General:  Well developed, well nourished, no acute distress. She is alert and oriented x3. Skin:  Warm and dry Neck:  Midline trachea, no thyromegaly or nodules Cardiovascular: Regular rate and rhythm, no murmur heard Lungs:  Effort normal, all lung fields clear to auscultation bilaterally Breasts:  No dominant palpable mass, retraction, or nipple discharge Abdomen:  Soft, non tender, no hepatosplenomegaly or masses Pelvic:  External genitalia  is normal in appearance.  The vagina is normal in appearance. The cervix is bulbous, no CMT.  Thin prep pap is done with HR HPV cotesting. Uterus is felt to be normal size, shape, and contour.  No adnexal masses or tenderness noted. IUD string noted Extremities:  No swelling or varicosities noted Psych:  She has a normal mood and affect  Assessment:   Healthy well-woman exam IUD check overweight  Plan:  Labs obtained- will follow up accordingly Will start on weight loss medication and f/u in  4 weeks  F/U 1 year for Ae, or sooner if needed   Melody Rockney Ghee, CNM

## 2018-01-21 NOTE — Patient Instructions (Signed)
Preventive Care 18-39 Years, Female Preventive care refers to lifestyle choices and visits with your health care provider that can promote health and wellness. What does preventive care include?  A yearly physical exam. This is also called an annual well check.  Dental exams once or twice a year.  Routine eye exams. Ask your health care provider how often you should have your eyes checked.  Personal lifestyle choices, including: ? Daily care of your teeth and gums. ? Regular physical activity. ? Eating a healthy diet. ? Avoiding tobacco and drug use. ? Limiting alcohol use. ? Practicing safe sex. ? Taking vitamin and mineral supplements as recommended by your health care provider. What happens during an annual well check? The services and screenings done by your health care provider during your annual well check will depend on your age, overall health, lifestyle risk factors, and family history of disease. Counseling Your health care provider may ask you questions about your:  Alcohol use.  Tobacco use.  Drug use.  Emotional well-being.  Home and relationship well-being.  Sexual activity.  Eating habits.  Work and work Statistician.  Method of birth control.  Menstrual cycle.  Pregnancy history.  Screening You may have the following tests or measurements:  Height, weight, and BMI.  Diabetes screening. This is done by checking your blood sugar (glucose) after you have not eaten for a while (fasting).  Blood pressure.  Lipid and cholesterol levels. These may be checked every 5 years starting at age 38.  Skin check.  Hepatitis C blood test.  Hepatitis B blood test.  Sexually transmitted disease (STD) testing.  BRCA-related cancer screening. This may be done if you have a family history of breast, ovarian, tubal, or peritoneal cancers.  Pelvic exam and Pap test. This may be done every 3 years starting at age 38. Starting at age 30, this may be done  every 5 years if you have a Pap test in combination with an HPV test.  Discuss your test results, treatment options, and if necessary, the need for more tests with your health care provider. Vaccines Your health care provider may recommend certain vaccines, such as:  Influenza vaccine. This is recommended every year.  Tetanus, diphtheria, and acellular pertussis (Tdap, Td) vaccine. You may need a Td booster every 10 years.  Varicella vaccine. You may need this if you have not been vaccinated.  HPV vaccine. If you are 39 or younger, you may need three doses over 6 months.  Measles, mumps, and rubella (MMR) vaccine. You may need at least one dose of MMR. You may also need a second dose.  Pneumococcal 13-valent conjugate (PCV13) vaccine. You may need this if you have certain conditions and were not previously vaccinated.  Pneumococcal polysaccharide (PPSV23) vaccine. You may need one or two doses if you smoke cigarettes or if you have certain conditions.  Meningococcal vaccine. One dose is recommended if you are age 68-21 years and a first-year college student living in a residence hall, or if you have one of several medical conditions. You may also need additional booster doses.  Hepatitis A vaccine. You may need this if you have certain conditions or if you travel or work in places where you may be exposed to hepatitis A.  Hepatitis B vaccine. You may need this if you have certain conditions or if you travel or work in places where you may be exposed to hepatitis B.  Haemophilus influenzae type b (Hib) vaccine. You may need this  if you have certain risk factors.  Talk to your health care provider about which screenings and vaccines you need and how often you need them. This information is not intended to replace advice given to you by your health care provider. Make sure you discuss any questions you have with your health care provider. Document Released: 11/26/2001 Document Revised:  06/19/2016 Document Reviewed: 08/01/2015 Elsevier Interactive Patient Education  [employer].

## 2018-01-22 LAB — LIPID PANEL
CHOL/HDL RATIO: 4.2 ratio (ref 0.0–4.4)
CHOLESTEROL TOTAL: 167 mg/dL (ref 100–199)
HDL: 40 mg/dL (ref >39)
LDL CALC: 110 mg/dL — AB (ref 0–99)
Triglycerides: 84 mg/dL (ref 0–149)
VLDL Cholesterol Cal: 17 mg/dL (ref 5–40)

## 2018-01-22 LAB — COMPREHENSIVE METABOLIC PANEL
ALT: 13 IU/L (ref 0–32)
AST: 15 IU/L (ref 0–40)
Albumin/Globulin Ratio: 1.6 (ref 1.2–2.2)
Albumin: 4.6 g/dL (ref 3.5–5.5)
Alkaline Phosphatase: 42 IU/L (ref 39–117)
BUN/Creatinine Ratio: 17 (ref 9–23)
BUN: 11 mg/dL (ref 6–20)
Bilirubin Total: 0.3 mg/dL (ref 0.0–1.2)
CALCIUM: 8.8 mg/dL (ref 8.7–10.2)
CO2: 22 mmol/L (ref 20–29)
CREATININE: 0.65 mg/dL (ref 0.57–1.00)
Chloride: 103 mmol/L (ref 96–106)
GFR, EST AFRICAN AMERICAN: 137 mL/min/{1.73_m2} (ref >59)
GFR, EST NON AFRICAN AMERICAN: 119 mL/min/{1.73_m2} (ref >59)
GLOBULIN, TOTAL: 2.8 g/dL (ref 1.5–4.5)
Glucose: 100 mg/dL — ABNORMAL HIGH (ref 65–99)
Potassium: 4.1 mmol/L (ref 3.5–5.2)
Sodium: 138 mmol/L (ref 134–144)
TOTAL PROTEIN: 7.4 g/dL (ref 6.0–8.5)

## 2018-01-22 LAB — CBC
HEMATOCRIT: 39 % (ref 34.0–46.6)
Hemoglobin: 12.7 g/dL (ref 11.1–15.9)
MCH: 27.4 pg (ref 26.6–33.0)
MCHC: 32.6 g/dL (ref 31.5–35.7)
MCV: 84 fL (ref 79–97)
PLATELETS: 277 10*3/uL (ref 150–379)
RBC: 4.64 x10E6/uL (ref 3.77–5.28)
RDW: 13.5 % (ref 12.3–15.4)
WBC: 7 10*3/uL (ref 3.4–10.8)

## 2018-01-22 LAB — THYROID PANEL WITH TSH
FREE THYROXINE INDEX: 1.8 (ref 1.2–4.9)
T3 Uptake Ratio: 25 % (ref 24–39)
T4, Total: 7.3 ug/dL (ref 4.5–12.0)
TSH: 2.21 u[IU]/mL (ref 0.450–4.500)

## 2018-01-23 LAB — CYTOLOGY - PAP

## 2018-02-02 ENCOUNTER — Encounter: Payer: Self-pay | Admitting: Obstetrics and Gynecology

## 2018-02-03 ENCOUNTER — Encounter: Payer: Self-pay | Admitting: Obstetrics and Gynecology

## 2018-02-03 NOTE — Telephone Encounter (Signed)
Patient called and is inquiring about her lab results. Patient seen results on Mychart and is concern about them . Patient is requesting a call back,. Her contact # is [phone].

## 2018-02-13 ENCOUNTER — Encounter: Payer: Self-pay | Admitting: Obstetrics and Gynecology

## 2018-02-13 ENCOUNTER — Ambulatory Visit (INDEPENDENT_AMBULATORY_CARE_PROVIDER_SITE_OTHER): Payer: 59 | Admitting: Obstetrics and Gynecology

## 2018-02-13 ENCOUNTER — Other Ambulatory Visit: Payer: Self-pay | Admitting: Obstetrics and Gynecology

## 2018-02-13 VITALS — BP 114/87 | HR 96 | Ht 66.0 in | Wt 175.0 lb

## 2018-02-13 DIAGNOSIS — R87810 Cervical high risk human papillomavirus (HPV) DNA test positive: Secondary | ICD-10-CM

## 2018-02-13 DIAGNOSIS — R87610 Atypical squamous cells of undetermined significance on cytologic smear of cervix (ASC-US): Secondary | ICD-10-CM | POA: Diagnosis not present

## 2018-02-13 DIAGNOSIS — N871 Moderate cervical dysplasia: Secondary | ICD-10-CM | POA: Diagnosis not present

## 2018-02-13 NOTE — Progress Notes (Signed)
ENCOMPASS COLPOSCOPY PROCEDURE NOTE  32 y.o. G2P1001 here for colposcopy for ASCUS with POSITIVE high risk HPV pap smear on 01/21/18. Discussed role for HPV in cervical dysplasia, need for surveillance.  Patient given informed consent, signed copy in the chart, time out was performed.  Placed in lithotomy position. Cervix viewed with speculum and colposcope after application of acetic acid. IUD string noted and not disturbed during procedure.  Colposcopy adequate? Yes  acetowhite lesion(s) noted at 10 & 2 o'clock; corresponding biopsies obtained.  ECC specimen obtained. All specimens were labelled and sent to pathology. See scanned colpo sheet with detailed drawing.   Patient was given post procedure instructions.  Will follow up pathology and manage accordingly.  Routine preventative health maintenance measures emphasized.     Bettina Warn Rockney Ghee, CNM

## 2018-02-13 NOTE — Patient Instructions (Signed)
Colposcopy, Care After  This sheet gives you information about how to care for yourself after your procedure. Your doctor may also give you more specific instructions. If you have problems or questions, contact your doctor.  What can I expect after the procedure?  If you did not have a tissue sample removed (did not have a biopsy), you may only have some spotting for a few days. You can go back to your normal activities.  If you had a tissue sample removed, it is common to have:  · Soreness and pain. This may last for a few days.  · Light-headedness.  · Mild bleeding from your vagina or dark-colored, grainy discharge from your vagina. This may last for a few days. You may need to wear a sanitary pad.  · Spotting for at least 48 hours after the procedure.    Follow these instructions at home:  · Take over-the-counter and prescription medicines only as told by your doctor. Ask your doctor what medicines you can start taking again. This is very important if you take blood-thinning medicine.  · Do not drive or use heavy machinery while taking prescription pain medicine.  · For 3 days, or as long as your doctor tells you, avoid:  ? Douching.  ? Using tampons.  ? Having sex.  · If you use birth control (contraception), keep using it.  · Limit activity for the first day after the procedure. Ask your doctor what activities are safe for you.  · It is up to you to get the results of your procedure. Ask your doctor when your results will be ready.  · Keep all follow-up visits as told by your doctor. This is important.  Contact a doctor if:  · You get a skin rash.  Get help right away if:  · You are bleeding a lot from your vagina. It is a lot of bleeding if you are using more than one pad an hour for 2 hours in a row.  · You have clumps of blood (blood clots) coming from your vagina.  · You have a fever.  · You have chills  · You have pain in your lower belly (pelvic area).  · You have signs of infection, such as vaginal  discharge that is:  ? Different than usual.  ? Yellow.  ? Bad-smelling.  · You have very pain or cramps in your lower belly that do not get better with medicine.  · You feel light-headed.  · You feel dizzy.  · You pass out (faint).  Summary  · If you did not have a tissue sample removed (did not have a biopsy), you may only have some spotting for a few days. You can go back to your normal activities.  · If you had a tissue sample removed, it is common to have mild pain and spotting for 48 hours.  · For 3 days, or as long as your doctor tells you, avoid douching, using tampons and having sex.  · Get help right away if you have bleeding, very bad pain, or signs of infection.  This information is not intended to replace advice given to you by your health care provider. Make sure you discuss any questions you have with your health care provider.  Document Released: 03/18/2008 Document Revised: 06/19/2016 Document Reviewed: 06/19/2016  Elsevier Interactive Patient Education © 2018 Elsevier Inc.

## 2018-02-25 ENCOUNTER — Encounter: Payer: Self-pay | Admitting: Obstetrics and Gynecology

## 2018-02-25 ENCOUNTER — Ambulatory Visit (INDEPENDENT_AMBULATORY_CARE_PROVIDER_SITE_OTHER): Payer: 59 | Admitting: Obstetrics and Gynecology

## 2018-02-25 VITALS — BP 115/74 | HR 90 | Ht 66.0 in | Wt 176.9 lb

## 2018-02-25 DIAGNOSIS — Z309 Encounter for contraceptive management, unspecified: Secondary | ICD-10-CM | POA: Diagnosis not present

## 2018-02-25 DIAGNOSIS — D069 Carcinoma in situ of cervix, unspecified: Secondary | ICD-10-CM | POA: Diagnosis not present

## 2018-02-25 NOTE — Patient Instructions (Signed)
1.  LEEP cone biopsy scheduled 2.  Return week before surgery for preop appointment   Loop Electrosurgical Excision Procedure Loop electrosurgical excision procedure (LEEP) is the cutting and removal (excision) of part of the cervix. The cervix is the bottom part of the uterus that opens into the vagina. The tissue that is removed from the cervix is then examined to see if there are precancerous cells or cancer cells present. LEEP may be done when:  You have abnormal bleeding from your cervix.  You have an abnormal Pap test result.  Your health care provider finds an abnormality on your cervix during a pelvic exam.  LEEP typically only takes a few minutes and is often done in the health care provider's office. The procedure is safe for women who are trying to get pregnant. However, the procedure is usually not done during a menstrual period or during pregnancy. Tell a health care provider about:  Any allergies you have.  All medicines you are taking, including vitamins, herbs, eye drops, creams, and over-the-counter medicines.  Any problems you or family members have had with anesthetic medicines.  Any blood disorders you have.  Any surgeries you have had.  Any medical conditions you have, including current or past vaginal infections, such as herpes or sexually transmitted infections (STIs).  Whether you are pregnant or may be pregnant. What are the risks? Generally, this is a safe procedure. However, problems may occur, including:  Infection.  Bleeding.  Allergic reactions to medicines.  Changes or scarring in the cervix.  Increased risk of early (preterm) labor in future pregnancies.  What happens before the procedure?  Ask your health care provider about: ? Changing or stopping your regular medicines. This is especially important if you are taking diabetes medicines or blood thinners. ? Taking medicines such as aspirin and ibuprofen. These medicines can thin your  blood. Do not take these medicines before your procedure if your health care provider instructs you not to.  Your health care provider may recommend that you take pain medicine before the procedure.  Plan to have someone take you home after the procedure. What happens during the procedure?  An instrument called a speculum will be placed in your vagina. This will allow your health care provider to see the cervix.  You will be given a medicine to numb the area (local anesthetic). The medicine will be injected into your cervix and the surrounding area.  A solution will be applied to your cervix. This solution will help the health care provider find the abnormal cells that need to be removed.  A thin wire loop will be passed through your vagina. The wire will be used to burn (cauterize) the cervical tissue with an electrical current.  The abnormal cervical tissue will be removed.  Any open blood vessels will be cauterized to prevent bleeding.  A paste may be applied to the cauterized area of your cervix to help prevent bleeding.  The sample of cervical tissue will be examined under a microscope. The procedure may vary among health care providers and hospitals. What happens after the procedure?  You may have mild abdominal cramping.  You may have a small amount of bleeding (spotting) from the vagina.  You may have a dark-colored discharge coming from your vagina. This is from the paste that was used on the cervix to prevent bleeding.  Your health care provider may recommend pelvic rest. Pelvic rest generally means avoiding sex and not putting anything in the vagina, such  as tampons, creams, and douches.  It is your responsibility to get your test results. Ask your health care provider or the department performing the test when your results will be ready. This information is not intended to replace advice given to you by your health care provider. Make sure you discuss any questions you  have with your health care provider. Document Released: 12/21/2002 Document Revised: 10/27/2015 Document Reviewed: 08/14/2015 Elsevier Interactive Patient Education  2018 [employer].

## 2018-02-27 ENCOUNTER — Telehealth: Payer: Self-pay | Admitting: *Deleted

## 2018-02-27 DIAGNOSIS — D069 Carcinoma in situ of cervix, unspecified: Secondary | ICD-10-CM | POA: Insufficient documentation

## 2018-02-27 NOTE — Progress Notes (Signed)
Chief complaint: 1.  High-grade dysplasia on colposcopic directed biopsies  GYN ENCOUNTER NOTE  Subjective:       Carolyn Robles is a 32 y.o. G46P2002 female is here for gynecologic evaluation of the following issues:  1.  Conference to discuss management of high-grade dysplasia  32 year old female para 1-0-0-1, with recent ASCUS/positive high risk HPV Pap smear on 01/21/2018 underwent colposcopy with cervical biopsies on 02/13/2018. Findings included visualization of the squamocolumnar junction; there were acetowhite lesions noted at 10:00 and 2:00. Pathology from biopsies: Cervix biopsy at 10:00-CIN-2 to 3/CIS (no evidence of invasion) Cervix biopsy at 2:00-CIN-2 to 3/CIS (no evidence of invasion) ECC-negative    Gynecologic History Patient's last menstrual period was 02/23/2018 (exact date). Contraception: Mirena IUD Last Pap: 01/21/2018 ASCUS/positive Last mammogram: N/A  Obstetric History OB History  Gravida Para Term Preterm AB Living  2 2 2  0 0 2  SAB TAB Ectopic Multiple Live Births  0 0 0 0 2    # Outcome Date GA Lbr Len/2nd Weight Sex Delivery Anes PTL Lv  2 Term 2016   6 lb 1.8 oz (2.771 kg) M Vag-Spont   LIV  1 Term 09/22/13 [redacted]w[redacted]d  6 lb 6 oz (2.892 kg) F Vag-Spont EPI N LIV    Past Medical History:  Diagnosis Date  . Asthma    seasonal    Past Surgical History:  Procedure Laterality Date  . NO PAST SURGERIES      Current Outpatient Medications on File Prior to Visit  Medication Sig Dispense Refill  . albuterol (PROVENTIL HFA;VENTOLIN HFA) 108 (90 Base) MCG/ACT inhaler Inhale 2 puffs into the lungs every 6 (six) hours as needed for wheezing or shortness of breath. 1 Inhaler 9  . cyanocobalamin (,VITAMIN B-12,) 1000 MCG/ML injection   1  . fluticasone (FLONASE) 50 MCG/ACT nasal spray Place 2 sprays into both nostrils daily. 16 g 6  . levocetirizine (XYZAL) 5 MG tablet Take 1 tablet (5 mg total) by mouth every evening. 30 tablet 12  . PARAGARD INTRAUTERINE  COPPER IU by Intrauterine route.    . phentermine (ADIPEX-P) 37.5 MG tablet   2  . triamcinolone ointment (KENALOG) 0.1 % Apply 1 application topically 2 (two) times daily. 30 g 6   No current facility-administered medications on file prior to visit.     No Known Allergies  Social History   Socioeconomic History  . Marital status: Married    Spouse name: Not on file  . Number of children: Not on file  . Years of education: Not on file  . Highest education level: Not on file  Occupational History  . Not on file  Social Needs  . Financial resource strain: Not on file  . Food insecurity:    Worry: Not on file    Inability: Not on file  . Transportation needs:    Medical: Not on file    Non-medical: Not on file  Tobacco Use  . Smoking status: Never Smoker  . Smokeless tobacco: Never Used  Substance and Sexual Activity  . Alcohol use: No  . Drug use: No  . Sexual activity: Yes    Birth control/protection: IUD    Comment: paragard  Lifestyle  . Physical activity:    Days per week: Not on file    Minutes per session: Not on file  . Stress: Not on file  Relationships  . Social connections:    Talks on phone: Not on file    Gets together: Not  on file    Attends religious service: Not on file    Active member of club or organization: Not on file    Attends meetings of clubs or organizations: Not on file    Relationship status: Not on file  . Intimate partner violence:    Fear of current or ex partner: Not on file    Emotionally abused: Not on file    Physically abused: Not on file    Forced sexual activity: Not on file  Other Topics Concern  . Not on file  Social History Narrative  . Not on file    Family History  Problem Relation Age of Onset  . Heart disease Mother   . Diabetes Father     The following portions of the patient's history were reviewed and updated as appropriate: allergies, current medications, past family history, past medical history, past  social history, past surgical history and problem list.  Review of Systems Review of Systems -no postcoital bleeding; no pelvic pain; no vaginal discharge  Objective:   BP 115/74   Pulse 90   Ht 5\' 6"  (1.676 m)   Wt 176 lb 14.4 oz (80.2 kg)   LMP 02/23/2018 (Exact Date)   Breastfeeding? No   BMI 28.55 kg/m  CONSTITUTIONAL: Well-developed, well-nourished female in no acute distress.  Physical exam-deferred   Assessment:   1.  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  Contraception-Mirena IUD   Plan:   1.  Surgical management of high-grade dysplasia was reviewed with patient and partner along with recommendations for long-term surveillance.  Pathophysiology of HPV was also discussed 2.  Patient desires removal of IUD at the time of LEEP cone biopsy 3.  LEEP cone biopsy of the cervix is scheduled for same-day surgery 4.  Patient will return for preop appointment the week for surgery  A total of 25 minutes were spent face-to-face with the patient during this encounter and over half of that time dealt with counseling and coordination of care.  Brayton Mars, MD  Note: This dictation was prepared with Dragon dictation along with smaller phrase technology. Any transcriptional errors that result from this process are unintentional.

## 2018-02-27 NOTE — Telephone Encounter (Signed)
Pt prefers to have her leep in house. OK per mad. Needs to be a Monday that there is a lite surgery. [place] is to look at schedule and I will call pt.

## 2018-02-27 NOTE — Telephone Encounter (Signed)
Patient called and states that she needs to talk to a nurse regarding her Leep procedure. Patient call back # is [phone]. Please advise. Thank you

## 2018-03-03 NOTE — Telephone Encounter (Signed)
Per [place] pt has leep scheduled for 03/16/2018 at 2pm. Pt aware per vm.

## 2018-03-06 ENCOUNTER — Encounter: Payer: Self-pay | Admitting: Obstetrics and Gynecology

## 2018-03-11 ENCOUNTER — Encounter: Payer: Self-pay | Admitting: Obstetrics and Gynecology

## 2018-03-12 ENCOUNTER — Encounter: Payer: 59 | Admitting: Obstetrics and Gynecology

## 2018-03-16 ENCOUNTER — Ambulatory Visit (INDEPENDENT_AMBULATORY_CARE_PROVIDER_SITE_OTHER): Payer: 59 | Admitting: Obstetrics and Gynecology

## 2018-03-16 ENCOUNTER — Encounter: Payer: Self-pay | Admitting: Obstetrics and Gynecology

## 2018-03-16 VITALS — BP 104/65 | HR 80 | Ht 66.0 in | Wt 174.8 lb

## 2018-03-16 DIAGNOSIS — Z30431 Encounter for routine checking of intrauterine contraceptive device: Secondary | ICD-10-CM

## 2018-03-16 DIAGNOSIS — D069 Carcinoma in situ of cervix, unspecified: Secondary | ICD-10-CM | POA: Diagnosis not present

## 2018-03-16 DIAGNOSIS — T8332XA Displacement of intrauterine contraceptive device, initial encounter: Secondary | ICD-10-CM

## 2018-03-16 NOTE — Addendum Note (Signed)
Addended by: Elouise Munroe on: 03/16/2018 04:11 PM   Modules accepted: Orders

## 2018-03-16 NOTE — Patient Instructions (Signed)
1.  LEEP procedure is performed today for removal of cervical dysplasia 2.  IUD was attempted to be removed today but was unsuccessful due to abnormal migration of the IUD within the lower uterine segment  LEEP POST-PROCEDURE INSTRUCTIONS  1. You may take Ibuprofen, Aleve or Tylenol for pain if needed.  Cramping is normal.  2. You will have black and/or bloody discharge at first.  This will lighten and then turn clear before completely resolving.  This will take 2 to 3 weeks.  3. Put nothing in your vagina until the bleeding or discharge stops (usually 2 or3 days).  4. You need to call if you have redness around the biopsy site, if there is any unusual draining, if the bleeding is heavy, or if you are concerned.  5. Shower or bathe as normal  6. We will call you within one week with results or we will discuss the results at your follow-up appointment if needed.  7. You will need to return for a follow-up Pap smear as directed by your physician.

## 2018-03-16 NOTE — Progress Notes (Signed)
Chief complaint: 1.  IUD removal 2.  LEEP procedure for management of CIN-3/CIS  The patient presents today for IUD removal and LEEP procedure of the cervix. Patient reports no significant pain with menses, no pain with intercourse, no abnormal uterine bleeding.  However, she would like the IUD out after 2+ years of use because of concern that it may be contributing to her abnormal Pap smears.  She understands that this is likely not the case but we were going to be compliant with her request.  Patient has CIN-3/CIS of the cervix on colposcopic directed biopsies; biopsies were performed at 2:00 and 10:00.  OBJECTIVE: BP 104/65   Pulse 80   Ht 5\' 6"  (1.676 m)   Wt 174 lb 12.8 oz (79.3 kg)   LMP 02/23/2018 (Exact Date)   BMI 28.21 kg/m  Pleasant female no acute distress.  Alert and oriented. Abdomen: Soft, nontender Pelvic exam: External genitalia-normal BUS-normal Vagina-no significant discharge; IUD strings are noted coming from the cervix and are approximately 4 cm in length Cervix-parous; no cervical motion tenderness; no gross abnormalities visualized Uterus-not examined Adnexa-not examined  PROCEDURE: IUD removal Indications: Patient desire for alternative contraception Findings: IUD strings 4 cm; IUD is densely adherent to the lower uterine segment and is unable to be removed with moderate pull using [place] forceps holding the IUD strings.  Several attempts were made with out success.  Grasping of the IUD was unsuccessful.  Decision was made to discontinue the IUD removal.  Daily strings were pushed up into the endocervical canal so as not to interfere with the LEEP cone biopsy.  PROCEDURE: LEEP cone biopsy Indications: CIN-3/CIS Findings: Nonstaining area of Lugol's between the 3:00-12:00-10 o'clock position; no other abnormal lesions seen Biopsies:  1.  Cervix 3:00  2.  Cervix 12:00  3.  Cervix  4.  ECC Description: Patient was placed in the dorsolithotomy position.   Verbal consent was obtained.  A coated speculum was placed in the vagina to facilitate visualization of the cervix and upper adjacent vagina.  1% lidocaine with 1-200,000 epinephrine was injected primarily at the 3 and 9:00 positions, secondarily at the 6 and 12:00 positions in order to facilitate control of intraoperative pain and blood loss.  A total of 15 cc was injected.  Lugol's solution was placed on the vagina and cervix with the above-noted findings being visualized.  A wide loop was then utilized to remove the portion of the ectocervix between 3:00 and 1:00, followed by 12:00 to 11:00, followed by 10-9 o'clock.  A post LEEP ECC was performed with serrated curette.  Rollerball was then utilized to cauterize the cone bed.  Monsel solution was applied for hemostasis.  Procedure was well-tolerated.  Blood loss was approximately 15 cc.  Complications were none.  Postprocedure instructions were given.  ASSESSMENT: 1.  ParaGard IUD, embedded in lower uterine segment, with inability to remove (procedure aborted) 2.  CIN-3/CIS of the cervix 3.  LEEP procedure for removal of abnormal Lugol's staining cervix between 3:00-12:00-10:00 4.  Postprocedure instructions are given 5.  Recommend alternative contraceptive method considering that the ParaGard IUD is not in optimal position and cannot be trusted for contraception. 6.  Return in 4 weeks for follow-up  A total of 15 minutes were spent face-to-face with the patient during this encounter and over half of that time dealt with counseling and coordination of care.  Brayton Mars, MD  Note: This dictation was prepared with Dragon dictation along with smaller phrase technology. Any  transcriptional errors that result from this process are unintentional.

## 2018-03-18 LAB — PATHOLOGY

## 2018-03-20 ENCOUNTER — Telehealth: Payer: Self-pay | Admitting: Obstetrics and Gynecology

## 2018-03-20 NOTE — Telephone Encounter (Deleted)
The patient called stating her BCPs ran out today and is needing a refill.  She is still using [facility] on [address].  Please advise, thank you.

## 2018-03-20 NOTE — Telephone Encounter (Signed)
Pt aware Leep results. Had AC review. Pt has had only spotting. No cramps. Pt aware to keep post op with [facility] for further management.

## 2018-03-23 ENCOUNTER — Encounter: Payer: Self-pay | Admitting: Obstetrics and Gynecology

## 2018-03-26 ENCOUNTER — Telehealth: Payer: Self-pay | Admitting: Obstetrics and Gynecology

## 2018-03-26 NOTE — Telephone Encounter (Signed)
The patient called and stated that she really needs to speak with a nurse in regards to her having some complications after having her surgery, The patient is having watery discharge, bleeding and is having dizzy spells. The patient is in a lot of discomfort and is hoping to speak to someone today possibly. Please advise.

## 2018-03-27 NOTE — Telephone Encounter (Signed)
See my chart message

## 2018-04-03 ENCOUNTER — Other Ambulatory Visit: Payer: Self-pay

## 2018-04-03 ENCOUNTER — Encounter: Payer: Self-pay | Admitting: Obstetrics and Gynecology

## 2018-04-03 MED ORDER — AZITHROMYCIN 250 MG PO TABS: ORAL_TABLET | ORAL | 0 refills | Status: DC

## 2018-04-14 ENCOUNTER — Other Ambulatory Visit: Payer: Self-pay | Admitting: Obstetrics and Gynecology

## 2018-04-14 MED ORDER — SPIRONOLACTONE 50 MG PO TABS: 50.0000 mg | ORAL_TABLET | Freq: Two times a day (BID) | ORAL | 2 refills | Status: DC

## 2018-04-21 ENCOUNTER — Ambulatory Visit
Admission: RE | Admit: 2018-04-21 | Discharge: 2018-04-21 | Disposition: A | Discharge status: Home / Self Care | Payer: 59 | Source: Ambulatory Visit | Attending: Obstetrics and Gynecology | Admitting: Obstetrics and Gynecology

## 2018-04-21 ENCOUNTER — Encounter: Payer: Self-pay | Admitting: Obstetrics and Gynecology

## 2018-04-21 ENCOUNTER — Ambulatory Visit (INDEPENDENT_AMBULATORY_CARE_PROVIDER_SITE_OTHER): Payer: 59 | Admitting: Obstetrics and Gynecology

## 2018-04-21 VITALS — BP 114/71 | HR 80 | Ht 66.0 in | Wt 178.0 lb

## 2018-04-21 DIAGNOSIS — D069 Carcinoma in situ of cervix, unspecified: Secondary | ICD-10-CM

## 2018-04-21 DIAGNOSIS — T8332XA Displacement of intrauterine contraceptive device, initial encounter: Secondary | ICD-10-CM | POA: Insufficient documentation

## 2018-04-21 NOTE — Progress Notes (Signed)
Chief complaint: 1.  Follow-up on LEEP cone biopsy 2.  Embedded IUD   Patient presents for follow-up 5 weeks after LEEP cone biopsy.  IUD could not be removed at last visit because of abnormal adherence/possible perforation. She is not experiencing any abnormal uterine bleeding or pelvic pain at this time.  Pathology from LEEP: Diagnosis:  ECTOCERVIX, 1:00-3:00, LEEP:  HIGH-GRADE SQUAMOUS INTRAEPITHELIAL LESION (CIN 3). THE DYSPLASTIC  PROCESS EXTENDS TO THE EXOCERVICAL MARGIN OF EXCISION.  BSR/03/18/2018   Diagnosis:  ECTOCERVIX, 11:00-12:00, LEEP:  HIGH-GRADE SQUAMOUS INTRAEPITHELIAL LESION (CIN 3). THE DYSPLASTIC  PROCESS EXTENDS TO LESS THAN 1 MM FROM THE ENDOCERVICAL MARGIN OF  EXCISION.  BSR/03/18/2018   Diagnosis:  ECTOCERVIX, 9:00-10:00, LEEP:  HIGH-GRADE SQUAMOUS INTRAEPITHELIAL LESION (CIN 3). THE DYSPLASTIC  PROCESS EXTENDS TO THE ENDOCERVICAL MARGIN OF EXCISION.  BSR/03/18/2018   Diagnosis:  ENDOCERVIX, CURETTINGS:  MINUTE FRAGMENTS OF BENIGN ENDOCERVICAL GLANDS, INFLAMMATION, BLOOD,  AND MUCUS.  BSR/03/18/2018   OBJECTIVE: BP 114/71   Pulse 80   Ht 5\' 6"  (1.676 m)   Wt 178 lb (80.7 kg)   BMI 28.73 kg/m  Pleasant female no acute distress.  Alert and oriented. Abdomen: Soft, nontender without organomegaly Bladder: Nontender Pelvic exam: External genitalia-normal BUS-normal Vagina-no significant discharge Cervix-parous; cone bed is well-healed; IUD strings are 4 cm; attempt at removal of IUD today is unsuccessful due to densely adherent IUD-?  Embedded versus extrauterine location  ASSESSMENT: 1.  CIN-3; status post LEEP cone biopsy 5 weeks ago with a positive endocervical margin; negative post LEEP ECC 2.  IUD displacement-embedded versus extrauterine 3.  No further desire for childbearing-husband to get vasectomy  PLAN: 1.  Repeat colposcopy in 6 months 2.  2 view abdomen x-ray to assess for IUD location 3.  Return in 1 week for follow-up and  further management planning.  A total of 15 minutes were spent face-to-face with the patient during this encounter and over half of that time dealt with counseling and coordination of care.   Brayton Mars, MD  Note: This dictation was prepared with Dragon dictation along with smaller phrase technology. Any transcriptional errors that result from this process are unintentional.

## 2018-04-21 NOTE — Patient Instructions (Addendum)
1.  Abdominal x-rays are ordered for assessment of IUD location 2.  Return in 1 week for follow-up 3.  Follow-up in 6 months for colposcopy

## 2018-04-22 ENCOUNTER — Other Ambulatory Visit: Payer: Self-pay | Admitting: Obstetrics and Gynecology

## 2018-04-22 ENCOUNTER — Telehealth: Payer: Self-pay | Admitting: Obstetrics and Gynecology

## 2018-04-22 DIAGNOSIS — T8332XA Displacement of intrauterine contraceptive device, initial encounter: Secondary | ICD-10-CM

## 2018-04-22 NOTE — Telephone Encounter (Signed)
The patient called and stated that she would like to speak with Joyice Faster in regards to her x-ray she got yesterday. Please advise.

## 2018-04-22 NOTE — Telephone Encounter (Signed)
Spoke with pt- see xray report.

## 2018-04-24 ENCOUNTER — Ambulatory Visit (INDEPENDENT_AMBULATORY_CARE_PROVIDER_SITE_OTHER): Payer: 59

## 2018-04-24 DIAGNOSIS — T8332XA Displacement of intrauterine contraceptive device, initial encounter: Secondary | ICD-10-CM | POA: Diagnosis not present

## 2018-04-24 DIAGNOSIS — N8312 Corpus luteum cyst of left ovary: Secondary | ICD-10-CM

## 2018-04-28 ENCOUNTER — Ambulatory Visit (INDEPENDENT_AMBULATORY_CARE_PROVIDER_SITE_OTHER): Payer: 59 | Admitting: Obstetrics and Gynecology

## 2018-04-28 ENCOUNTER — Encounter: Payer: Self-pay | Admitting: Obstetrics and Gynecology

## 2018-04-28 VITALS — BP 99/64 | HR 81 | Ht 66.0 in | Wt 176.7 lb

## 2018-04-28 DIAGNOSIS — T8332XD Displacement of intrauterine contraceptive device, subsequent encounter: Secondary | ICD-10-CM | POA: Diagnosis not present

## 2018-04-28 NOTE — H&P (View-Only) (Signed)
PREOP HISTORY AND PHYSICAL  Date of surgery: 05/04/2018 Procedure: Hysteroscopy with removal of embedded IUD Diagnosis: Malpositioned/embedded IUD   Patient is a 32 y.o. G2P2090female scheduled for surgery on 05/04/2018 for hysteroscopic removal of malpositioned IUD.  IUD could not be removed in the office.  X-rays and ultrasound have demonstrated the IUD arms to be positioned within the longitudinal endocervical canal, while the body of the IUD is positioned horizontally, embedded in the myometrium and lower uterine segment.   Gynecologic History Patient's last menstrual period was 02/23/2018 (exact date). Contraception: Mirena IUD Last Pap: 01/21/2018 ASCUS/positive Last mammogram: N/A    OB History    Gravida  2   Para  2   Term  2   Preterm  0   AB  0   Living  2     SAB  0   TAB  0   Ectopic  0   Multiple  0   Live Births  2           No LMP recorded. (Menstrual status: Other).    Past Medical History:  Diagnosis Date  . Asthma    seasonal    Past Surgical History:  Procedure Laterality Date  . NO PAST SURGERIES      OB History  Gravida Para Term Preterm AB Living  2 2 2  0 0 2  SAB TAB Ectopic Multiple Live Births  0 0 0 0 2    # Outcome Date GA Lbr Len/2nd Weight Sex Delivery Anes PTL Lv  2 Term 2016   6 lb 1.8 oz (2.771 kg) M Vag-Spont   LIV  1 Term 09/22/13 [redacted]w[redacted]d  6 lb 6 oz (2.892 kg) F Vag-Spont EPI N LIV    Social History   Socioeconomic History  . Marital status: Married    Spouse name: Not on file  . Number of children: Not on file  . Years of education: Not on file  . Highest education level: Not on file  Occupational History  . Not on file  Social Needs  . Financial resource strain: Not on file  . Food insecurity:    Worry: Not on file    Inability: Not on file  . Transportation needs:    Medical: Not on file    Non-medical: Not on file  Tobacco Use  . Smoking status: Never Smoker  . Smokeless tobacco: Never Used   Substance and Sexual Activity  . Alcohol use: No  . Drug use: No  . Sexual activity: Yes    Birth control/protection: IUD    Comment: paragard  Lifestyle  . Physical activity:    Days per week: Not on file    Minutes per session: Not on file  . Stress: Not on file  Relationships  . Social connections:    Talks on phone: Not on file    Gets together: Not on file    Attends religious service: Not on file    Active member of club or organization: Not on file    Attends meetings of clubs or organizations: Not on file    Relationship status: Not on file  Other Topics Concern  . Not on file  Social History Narrative  . Not on file    Family History  Problem Relation Age of Onset  . Heart disease Mother   . Diabetes Father      (Not in a hospital admission)  No Known Allergies  Review of Systems Constitutional: No recent fever/chills/sweats  Respiratory: No recent cough/bronchitis Cardiovascular: No chest pain Gastrointestinal: No recent nausea/vomiting/diarrhea Genitourinary: No UTI symptoms Hematologic/lymphatic:No history of coagulopathy or recent blood thinner use    Objective:    BP 99/64 Comment: 99/64  Pulse 81   Ht 5\' 6"  (1.676 m)   Wt 176 lb 11.2 oz (80.2 kg)   BMI 28.52 kg/m   General:   Normal  Skin:   normal  HEENT:  Normal  Neck:  Supple without Adenopathy or Thyromegaly  Lungs:   Heart:              Breasts:   Abdomen:  Pelvis:  M/S   Extremeties:  Neuro:    clear to auscultation bilaterally   Normal without murmur   Not Examined   soft, non-tender; bowel sounds normal; no masses,  no organomegaly   Exam deferred to OR  No CVAT  Warm/Dry   Normal          Assessment:     1.  Malpositioned IUD   Plan:  Hysteroscopy with removal of IUD  Preop counseling: Patient is undergo hysteroscopic IUD removal and same-day surgery.  She is understanding of the planned procedure and is aware of and is accepting of all surgical risks which  include but are not limited to bleeding, infection, pelvic organ injury with need for repair, blood clot disorders, anesthesia risk, etc.  The patient understands that if the IUD is actually through the uterine wall and in the abdominal cavity, laparoscopy with removal may be required.  All questions have been answered.  Informed consent is given.  Patient is very willing to proceed with surgery as scheduled.  Brayton Mars, MD  Note: This dictation was prepared with Dragon dictation along with smaller phrase technology. Any transcriptional errors that result from this process are unintentional.

## 2018-04-28 NOTE — H&P (Addendum)
PREOP HISTORY AND PHYSICAL  Date of surgery: 05/04/2018 Procedure: Hysteroscopy with removal of embedded IUD Diagnosis: Malpositioned/embedded IUD   Patient is a 32 y.o. G2P2033female scheduled for surgery on 05/04/2018 for hysteroscopic removal of malpositioned IUD.  IUD could not be removed in the office.  X-rays and ultrasound have demonstrated the IUD arms to be positioned within the longitudinal endocervical canal, while the body of the IUD is positioned horizontally, embedded in the myometrium and lower uterine segment.   Gynecologic History Patient's last menstrual period was 02/23/2018 (exact date). Contraception: Mirena IUD Last Pap: 01/21/2018 ASCUS/positive Last mammogram: N/A    OB History    Gravida  2   Para  2   Term  2   Preterm  0   AB  0   Living  2     SAB  0   TAB  0   Ectopic  0   Multiple  0   Live Births  2           No LMP recorded. (Menstrual status: Other).    Past Medical History:  Diagnosis Date  . Asthma    seasonal    Past Surgical History:  Procedure Laterality Date  . NO PAST SURGERIES      OB History  Gravida Para Term Preterm AB Living  2 2 2  0 0 2  SAB TAB Ectopic Multiple Live Births  0 0 0 0 2    # Outcome Date GA Lbr Len/2nd Weight Sex Delivery Anes PTL Lv  2 Term 2016   6 lb 1.8 oz (2.771 kg) M Vag-Spont   LIV  1 Term 09/22/13 [redacted]w[redacted]d  6 lb 6 oz (2.892 kg) F Vag-Spont EPI N LIV    Social History   Socioeconomic History  . Marital status: Married    Spouse name: Not on file  . Number of children: Not on file  . Years of education: Not on file  . Highest education level: Not on file  Occupational History  . Not on file  Social Needs  . Financial resource strain: Not on file  . Food insecurity:    Worry: Not on file    Inability: Not on file  . Transportation needs:    Medical: Not on file    Non-medical: Not on file  Tobacco Use  . Smoking status: Never Smoker  . Smokeless tobacco: Never Used   Substance and Sexual Activity  . Alcohol use: No  . Drug use: No  . Sexual activity: Yes    Birth control/protection: IUD    Comment: paragard  Lifestyle  . Physical activity:    Days per week: Not on file    Minutes per session: Not on file  . Stress: Not on file  Relationships  . Social connections:    Talks on phone: Not on file    Gets together: Not on file    Attends religious service: Not on file    Active member of club or organization: Not on file    Attends meetings of clubs or organizations: Not on file    Relationship status: Not on file  Other Topics Concern  . Not on file  Social History Narrative  . Not on file    Family History  Problem Relation Age of Onset  . Heart disease Mother   . Diabetes Father      (Not in a hospital admission)  No Known Allergies  Review of Systems Constitutional: No recent fever/chills/sweats  Respiratory: No recent cough/bronchitis Cardiovascular: No chest pain Gastrointestinal: No recent nausea/vomiting/diarrhea Genitourinary: No UTI symptoms Hematologic/lymphatic:No history of coagulopathy or recent blood thinner use    Objective:    BP 99/64 Comment: 99/64  Pulse 81   Ht 5\' 6"  (1.676 m)   Wt 176 lb 11.2 oz (80.2 kg)   BMI 28.52 kg/m   General:   Normal  Skin:   normal  HEENT:  Normal  Neck:  Supple without Adenopathy or Thyromegaly  Lungs:   Heart:              Breasts:   Abdomen:  Pelvis:  M/S   Extremeties:  Neuro:    clear to auscultation bilaterally   Normal without murmur   Not Examined   soft, non-tender; bowel sounds normal; no masses,  no organomegaly   Exam deferred to OR  No CVAT  Warm/Dry   Normal          Assessment:     1.  Malpositioned IUD   Plan:  Hysteroscopy with removal of IUD  Preop counseling: Patient is undergo hysteroscopic IUD removal and same-day surgery.  She is understanding of the planned procedure and is aware of and is accepting of all surgical risks which  include but are not limited to bleeding, infection, pelvic organ injury with need for repair, blood clot disorders, anesthesia risk, etc.  The patient understands that if the IUD is actually through the uterine wall and in the abdominal cavity, laparoscopy with removal may be required.  All questions have been answered.  Informed consent is given.  Patient is very willing to proceed with surgery as scheduled.  Brayton Mars, MD  Note: This dictation was prepared with Dragon dictation along with smaller phrase technology. Any transcriptional errors that result from this process are unintentional.

## 2018-04-28 NOTE — Progress Notes (Signed)
Chief complaint: 1.  Malpositioned IUD  Patient presents today for review of pelvic ultrasound and x-rays and management planning regarding malpositioned IUD. X-rays of abdomen demonstrates a malpositioned IUD in the central pelvis. Ultrasound demonstrates similar findings with the arms of the IUD longitudinally in the endocervical canal, while the body of the IUD is transversely situated within the lower uterine segment myometrium.  Patient desires to have the IUD removed ASAP  OBJECTIVE: BP 99/64 Comment: 99/64  Pulse 81   Ht 5\' 6"  (1.676 m)   Wt 176 lb 11.2 oz (80.2 kg)   BMI 28.52 kg/m  Physical exam-deferred today  X-rays and ultrasound are reviewed-see detailed reports and images and medical record  ASSESSMENT: 1.  Malpositioned IUD-embedded within lower uterine segment 2.  Unable to remove IUD in office  PLAN: 1.  Hysteroscopic removal of IUD scheduled for same-day surgery-05/04/2018 2.  Options of management were reviewed; explanation of procedure and postop course also reviewed. 3.  Patient to return 05/26/2018 for postop check 4.  Preop H&P completed today.  A total of 15 minutes were spent face-to-face with the patient during this encounter and over half of that time dealt with counseling and coordination of care.   Brayton Mars, MD  Note: This dictation was prepared with Dragon dictation along with smaller phrase technology. Any transcriptional errors that result from this process are unintentional.

## 2018-04-28 NOTE — Patient Instructions (Signed)
1.  Hysteroscopy with removal of malpositioned IUD is scheduled for 05/04/2018 2.  Return for postop check on 05/26/2018 as scheduled  Hysteroscopy, Care After Refer to this sheet in the next few weeks. These instructions provide you with information on caring for yourself after your procedure. Your health care provider may also give you more specific instructions. Your treatment has been planned according to current medical practices, but problems sometimes occur. Call your health care provider if you have any problems or questions after your procedure. What can I expect after the procedure? After your procedure, it is typical to have the following:  You may have some cramping. This normally lasts for a couple days.  You may have bleeding. This can vary from light spotting for a few days to menstrual-like bleeding for 3-7 days.  Follow these instructions at home:  Rest for the first 1-2 days after the procedure.  Only take over-the-counter or prescription medicines as directed by your health care provider. Do not take aspirin. It can increase the chances of bleeding.  Take showers instead of baths for 2 weeks or as directed by your health care provider.  Do not drive for 24 hours or as directed.  Do not drink alcohol while taking pain medicine.  Do not use tampons, douche, or have sexual intercourse for 2 weeks or until your health care provider says it is okay.  Take your temperature twice a day for 4-5 days. Write it down each time.  Follow your health care provider's advice about diet, exercise, and lifting.  If you develop constipation, you may: ? Take a mild laxative if your health care provider approves. ? Add bran foods to your diet. ? Drink enough fluids to keep your urine clear or pale yellow.  Try to have someone with you or available to you for the first 24-48 hours, especially if you were given a general anesthetic.  Follow up with your health care provider as  directed. Contact a health care provider if:  You feel dizzy or lightheaded.  You feel sick to your stomach (nauseous).  You have abnormal vaginal discharge.  You have a rash.  You have pain that is not controlled with medicine. Get help right away if:  You have bleeding that is heavier than a normal menstrual period.  You have a fever.  You have increasing cramps or pain, not controlled with medicine.  You have new belly (abdominal) pain.  You pass out.  You have pain in the tops of your shoulders (shoulder strap areas).  You have shortness of breath. This information is not intended to replace advice given to you by your health care provider. Make sure you discuss any questions you have with your health care provider. Document Released: 07/21/2013 Document Revised: 03/07/2016 Document Reviewed: 04/29/2013 Elsevier Interactive Patient Education  2017 [employer].

## 2018-05-01 ENCOUNTER — Encounter: Payer: Self-pay | Admitting: *Deleted

## 2018-05-04 ENCOUNTER — Ambulatory Visit
Admission: RE | Admit: 2018-05-04 | Discharge: 2018-05-04 | Disposition: A | Discharge status: Home / Self Care | Payer: 59 | Source: Ambulatory Visit | Attending: Obstetrics and Gynecology | Admitting: Obstetrics and Gynecology

## 2018-05-04 ENCOUNTER — Encounter: Payer: Self-pay | Admitting: *Deleted

## 2018-05-04 ENCOUNTER — Ambulatory Visit: Payer: 59 | Admitting: Anesthesiology

## 2018-05-04 ENCOUNTER — Other Ambulatory Visit: Payer: Self-pay

## 2018-05-04 ENCOUNTER — Encounter
Admission: RE | Disposition: A | Discharge status: Home / Self Care | Payer: Self-pay | Source: Ambulatory Visit | Attending: Obstetrics and Gynecology

## 2018-05-04 DIAGNOSIS — Z8249 Family history of ischemic heart disease and other diseases of the circulatory system: Secondary | ICD-10-CM | POA: Insufficient documentation

## 2018-05-04 DIAGNOSIS — T8332XA Displacement of intrauterine contraceptive device, initial encounter: Secondary | ICD-10-CM | POA: Insufficient documentation

## 2018-05-04 DIAGNOSIS — T8339XA Other mechanical complication of intrauterine contraceptive device, initial encounter: Secondary | ICD-10-CM

## 2018-05-04 DIAGNOSIS — Z30432 Encounter for removal of intrauterine contraceptive device: Secondary | ICD-10-CM | POA: Diagnosis not present

## 2018-05-04 DIAGNOSIS — J45909 Unspecified asthma, uncomplicated: Secondary | ICD-10-CM | POA: Insufficient documentation

## 2018-05-04 DIAGNOSIS — T8332XD Displacement of intrauterine contraceptive device, subsequent encounter: Secondary | ICD-10-CM

## 2018-05-04 DIAGNOSIS — Y762 Prosthetic and other implants, materials and accessory obstetric and gynecological devices associated with adverse incidents: Secondary | ICD-10-CM | POA: Insufficient documentation

## 2018-05-04 HISTORY — PX: IUD REMOVAL: SHX5392

## 2018-05-04 HISTORY — PX: HYSTEROSCOPY: SHX211

## 2018-05-04 LAB — CBC WITH DIFFERENTIAL/PLATELET
Basophils Absolute: 0 10*3/uL (ref 0–0.1)
Basophils Relative: 0 %
EOS ABS: 0.4 10*3/uL (ref 0–0.7)
Eosinophils Relative: 7 %
HEMATOCRIT: 36 % (ref 35.0–47.0)
HEMOGLOBIN: 12.6 g/dL (ref 12.0–16.0)
LYMPHS ABS: 1.5 10*3/uL (ref 1.0–3.6)
LYMPHS PCT: 27 %
MCH: 28.8 pg (ref 26.0–34.0)
MCHC: 35 g/dL (ref 32.0–36.0)
MCV: 82.1 fL (ref 80.0–100.0)
Monocytes Absolute: 0.3 10*3/uL (ref 0.2–0.9)
Monocytes Relative: 6 %
NEUTROS ABS: 3.3 10*3/uL (ref 1.4–6.5)
NEUTROS PCT: 60 %
Platelets: 250 10*3/uL (ref 150–440)
RBC: 4.39 MIL/uL (ref 3.80–5.20)
RDW: 13.3 % (ref 11.5–14.5)
WBC: 5.5 10*3/uL (ref 3.6–11.0)

## 2018-05-04 LAB — POCT PREGNANCY, URINE: PREG TEST UR: NEGATIVE

## 2018-05-04 LAB — TYPE AND SCREEN
ABO/RH(D): A POS
ANTIBODY SCREEN: NEGATIVE

## 2018-05-04 LAB — RAPID HIV SCREEN (HIV 1/2 AB+AG)
HIV 1/2 Antibodies: NONREACTIVE
HIV-1 P24 Antigen - HIV24: NONREACTIVE

## 2018-05-04 SURGERY — HYSTEROSCOPY
Anesthesia: General | Site: Vagina | Wound class: Clean Contaminated

## 2018-05-04 MED ORDER — MIDAZOLAM HCL 2 MG/2ML IJ SOLN
INTRAMUSCULAR | Status: AC
  Filled 2018-05-04: qty 2

## 2018-05-04 MED ORDER — ONDANSETRON HCL 4 MG/2ML IJ SOLN
INTRAMUSCULAR | Status: DC | PRN
  Administered 2018-05-04: 4 mg via INTRAVENOUS

## 2018-05-04 MED ORDER — ACETAMINOPHEN 325 MG PO TABS: 325.0000 mg | ORAL_TABLET | ORAL | Status: DC | PRN

## 2018-05-04 MED ORDER — HYDROCODONE-ACETAMINOPHEN 7.5-325 MG PO TABS: 1.0000 | ORAL_TABLET | Freq: Once | ORAL | Status: DC | PRN

## 2018-05-04 MED ORDER — GLYCOPYRROLATE 0.2 MG/ML IJ SOLN
INTRAMUSCULAR | Status: AC
  Filled 2018-05-04: qty 1

## 2018-05-04 MED ORDER — LACTATED RINGERS IV SOLN
INTRAVENOUS | Status: DC
  Administered 2018-05-04: 07:00:00 via INTRAVENOUS

## 2018-05-04 MED ORDER — SILVER NITRATE-POT NITRATE 75-25 % EX MISC
CUTANEOUS | Status: AC
  Filled 2018-05-04: qty 1

## 2018-05-04 MED ORDER — MEPERIDINE HCL 50 MG/ML IJ SOLN: 6.2500 mg | INTRAMUSCULAR | Status: DC | PRN

## 2018-05-04 MED ORDER — IBUPROFEN 600 MG PO TABS: 600.0000 mg | ORAL_TABLET | Freq: Four times a day (QID) | ORAL | 0 refills | Status: DC

## 2018-05-04 MED ORDER — MIDAZOLAM HCL 5 MG/5ML IJ SOLN
INTRAMUSCULAR | Status: DC | PRN
  Administered 2018-05-04: 2 mg via INTRAVENOUS

## 2018-05-04 MED ORDER — PROPOFOL 10 MG/ML IV BOLUS
INTRAVENOUS | Status: DC | PRN
  Administered 2018-05-04: 200 mg via INTRAVENOUS

## 2018-05-04 MED ORDER — LIDOCAINE HCL (PF) 2 % IJ SOLN
INTRAMUSCULAR | Status: AC
Start: 2018-05-04 — End: ?
  Filled 2018-05-04: qty 10

## 2018-05-04 MED ORDER — LACTATED RINGERS IV SOLN: INTRAVENOUS | Status: DC

## 2018-05-04 MED ORDER — FENTANYL CITRATE (PF) 100 MCG/2ML IJ SOLN: 25.0000 ug | INTRAMUSCULAR | Status: DC | PRN

## 2018-05-04 MED ORDER — ONDANSETRON HCL 4 MG/2ML IJ SOLN
INTRAMUSCULAR | Status: AC
  Filled 2018-05-04: qty 2

## 2018-05-04 MED ORDER — SEVOFLURANE IN SOLN
RESPIRATORY_TRACT | Status: AC
  Filled 2018-05-04: qty 250

## 2018-05-04 MED ORDER — LIDOCAINE 2% (20 MG/ML) 5 ML SYRINGE
INTRAMUSCULAR | Status: DC | PRN
  Administered 2018-05-04: 100 mg via INTRAVENOUS

## 2018-05-04 MED ORDER — FENTANYL CITRATE (PF) 100 MCG/2ML IJ SOLN
INTRAMUSCULAR | Status: DC | PRN
  Administered 2018-05-04 (×2): 50 ug via INTRAVENOUS

## 2018-05-04 MED ORDER — PROMETHAZINE HCL 25 MG/ML IJ SOLN: 6.2500 mg | INTRAMUSCULAR | Status: DC | PRN

## 2018-05-04 MED ORDER — FENTANYL CITRATE (PF) 100 MCG/2ML IJ SOLN
INTRAMUSCULAR | Status: AC
  Filled 2018-05-04: qty 2

## 2018-05-04 MED ORDER — DEXAMETHASONE SODIUM PHOSPHATE 10 MG/ML IJ SOLN
INTRAMUSCULAR | Status: DC | PRN
  Administered 2018-05-04: 10 mg via INTRAVENOUS

## 2018-05-04 MED ORDER — ACETAMINOPHEN 160 MG/5ML PO SOLN
325.0000 mg | ORAL | Status: DC | PRN
  Filled 2018-05-04: qty 20.3

## 2018-05-04 MED ORDER — DEXAMETHASONE SODIUM PHOSPHATE 10 MG/ML IJ SOLN
INTRAMUSCULAR | Status: AC
  Filled 2018-05-04: qty 1

## 2018-05-04 MED ORDER — ACETAMINOPHEN 500 MG PO TABS: 1000.0000 mg | ORAL_TABLET | Freq: Four times a day (QID) | ORAL | 0 refills | Status: DC | PRN

## 2018-05-04 MED ORDER — PROPOFOL 500 MG/50ML IV EMUL
INTRAVENOUS | Status: AC
  Filled 2018-05-04: qty 50

## 2018-05-04 SURGICAL SUPPLY — 18 items
BAG INFUSER PRESSURE 100CC (MISCELLANEOUS) ×3 IMPLANT
CANISTER SUCT 3000ML PPV (MISCELLANEOUS) ×3 IMPLANT
CATH ROBINSON RED A/P 16FR (CATHETERS) ×3 IMPLANT
ELECT REM PT RETURN 9FT ADLT (ELECTROSURGICAL) ×3
ELECTRODE REM PT RTRN 9FT ADLT (ELECTROSURGICAL) ×1 IMPLANT
GLOVE BIO SURGEON STRL SZ8 (GLOVE) ×3 IMPLANT
GOWN STRL REUS W/ TWL LRG LVL3 (GOWN DISPOSABLE) ×1 IMPLANT
GOWN STRL REUS W/ TWL XL LVL3 (GOWN DISPOSABLE) ×1 IMPLANT
GOWN STRL REUS W/TWL LRG LVL3 (GOWN DISPOSABLE) ×2
GOWN STRL REUS W/TWL XL LVL3 (GOWN DISPOSABLE) ×2
IV LACTATED RINGERS 1000ML (IV SOLUTION) ×3 IMPLANT
KIT TURNOVER CYSTO (KITS) ×3 IMPLANT
PACK DNC HYST (MISCELLANEOUS) ×3 IMPLANT
PAD OB MATERNITY 4.3X12.25 (PERSONAL CARE ITEMS) ×3 IMPLANT
PAD PREP 24X41 OB/GYN DISP (PERSONAL CARE ITEMS) ×3 IMPLANT
TOWEL OR 17X26 4PK STRL BLUE (TOWEL DISPOSABLE) ×3 IMPLANT
TUBING CONNECTING 10 (TUBING) ×2 IMPLANT
TUBING CONNECTING 10' (TUBING) ×1

## 2018-05-04 NOTE — Op Note (Signed)
OPERATIVE NOTE:    Carolyn Robles PROCEDURE DATE: 05/04/2018   PREOPERATIVE DIAGNOSIS: See 1.  Embedded IUD  POSTOPERATIVE DIAGNOSIS: 1.  Embedded IUD  PROCEDURE: Hysteroscopy with removal of IUD SURGEON:  Brayton Mars, MD ASSISTANTS: PA-S [address] Filter ANESTHESIA: General INDICATIONS: 32 y.o. [id number] with identified malpositioning of IUD which was embedded in the lower uterine segment.  Attempts at removal in the office were unsuccessful.  Patient presents now for hysteroscopic removal.  FINDINGS: IUD was positioned transversely in the lower uterine segment with the rod  protruding through the lower uterine cervix at the 3 o'clock position, incompletely through.     I/O's: Total I/O In: 500 [I.V.:500] Out: 250 [Urine:250] COUNTS:  YES  SPECIMENS:  IUD ANTIBIOTIC PROPHYLAXIS:N/A COMPLICATIONS: None immediate  PROCEDURE IN DETAIL: Patient was brought to the operating room placed in the supine position.  General anesthesia was induced that difficulty.  She was placed in the dorsolithotomy position using candycane stirrups.  A Hibiclens perineal intravaginal prep and drape was performed in standard fashion. Timeout was completed. Red Robinson catheter was used to drain 250 mL of urine from the bladder. Weighted speculum placed in the vagina.  Single-tooth tenaculum was placed onto the anterior lip of the cervix.  The endocervical canal was dilated to a #8 [place] caliber.  Physical exam demonstrated a 3 mm nodular defect at the 3 o'clock position of the upper endocervical canal that was palpable on exam.  The IUD was not protruding through the cervical stroma. Hysteroscopy was performed without evidence of lesions within the endometrial cavity.  [place] forceps were used to grasp the IUD and with a fishhook removal maneuver, the IUD was extracted.  The previously noted defect at the 3 o'clock position of the endocervix was resolved.  Minimal bleeding was encountered.   Procedure was then terminated with all instrumentation being removed from the vagina.  The patient was awakened mobilized and taken to recovery room in satisfactory condition.  Blood loss was minimal.  Hassell Done A. Zipporah Plants, MD, ACO[facility]

## 2018-05-04 NOTE — Interval H&P Note (Signed)
History and Physical Interval Note:  05/04/2018 7:29 AM  Carolyn Robles  has presented today for surgery, with the diagnosis of MISPLACED IUD  The various methods of treatment have been discussed with the patient and family. After consideration of risks, benefits and other options for treatment, the patient has consented to  Procedure(s): HYSTEROSCOPY (N/A) INTRAUTERINE DEVICE (IUD) REMOVAL (N/A) as a surgical intervention .  The patient's history has been reviewed, patient examined, no change in status, stable for surgery.  I have reviewed the patient's chart and labs.  Questions were answered to the patient's satisfaction.     Hassell Done A Christian Treadway

## 2018-05-04 NOTE — Anesthesia Preprocedure Evaluation (Signed)
Anesthesia Evaluation  Patient identified by MRN, date of birth, ID band Patient awake    Reviewed: Allergy & Precautions, H&P , NPO status , reviewed documented beta blocker date and time   Airway Mallampati: II  TM Distance: >3 FB     Dental  (+) Teeth Intact   Pulmonary neg pulmonary ROS, asthma ,    Pulmonary exam normal        Cardiovascular negative cardio ROS Normal cardiovascular exam     Neuro/Psych negative neurological ROS  negative psych ROS   GI/Hepatic negative GI ROS, Neg liver ROS, neg GERD  ,  Endo/Other  negative endocrine ROS  Renal/GU      Musculoskeletal   Abdominal   Peds  Hematology negative hematology ROS (+)   Anesthesia Other Findings Past Medical History: No date: Asthma     Comment:  seasonal Past Surgical History: No date: NO PAST SURGERIES - Labor Epidural BMI    Body Mass Index:  28.50 kg/m     Reproductive/Obstetrics                             Anesthesia Physical Anesthesia Plan  ASA: II  Anesthesia Plan: General LMA   Post-op Pain Management:    Induction:   PONV Risk Score and Plan: 4 or greater and Ondansetron, Treatment may vary due to age or medical condition, Midazolam, Dexamethasone and Metaclopromide  Airway Management Planned:   Additional Equipment:   Intra-op Plan:   Post-operative Plan:   Informed Consent: I have reviewed the patients History and Physical, chart, labs and discussed the procedure including the risks, benefits and alternatives for the proposed anesthesia with the patient or authorized representative who has indicated his/her understanding and acceptance.   Dental Advisory Given  Plan Discussed with: CRNA  Anesthesia Plan Comments:         Anesthesia Quick Evaluation

## 2018-05-04 NOTE — Anesthesia Post-op Follow-up Note (Signed)
Anesthesia QCDR form completed.        

## 2018-05-04 NOTE — Transfer of Care (Signed)
Immediate Anesthesia Transfer of Care Note  Patient: Carolyn Robles  Procedure(s) Performed: HYSTEROSCOPY (N/A Vagina ) INTRAUTERINE DEVICE (IUD) REMOVAL (N/A Vagina )  Patient Location: PACU and Endoscopy Unit  Anesthesia Type:General  Level of Consciousness: awake, alert  and oriented  Airway & Oxygen Therapy: Patient Spontanous Breathing and Patient connected to nasal cannula oxygen  Post-op Assessment: Report given to RN and Post -op Vital signs reviewed and stable  Post vital signs: Reviewed and stable  Last Vitals:  Vitals Value Taken Time  BP    Temp    Pulse 78 05/04/2018  8:13 AM  Resp    SpO2 100 % 05/04/2018  8:13 AM  Vitals shown include unvalidated device data.  Last Pain:  Vitals:   05/04/18 0614  TempSrc: Tympanic  PainSc: 0-No pain         Complications: No apparent anesthesia complications

## 2018-05-04 NOTE — Anesthesia Procedure Notes (Signed)
Procedure Name: LMA Insertion Date/Time: 05/04/2018 7:56 AM Performed by: Marsh Dolly, CRNA Pre-anesthesia Checklist: Patient identified, Patient being monitored, Timeout performed, Emergency Drugs available and Suction available Patient Re-evaluated:Patient Re-evaluated prior to induction Oxygen Delivery Method: Circle system utilized Preoxygenation: Pre-oxygenation with 100% oxygen Induction Type: IV induction Ventilation: Mask ventilation without difficulty LMA: LMA inserted LMA Size: 4.0 Tube type: Oral Number of attempts: 1 Placement Confirmation: positive ETCO2 and breath sounds checked- equal and bilateral Tube secured with: Tape Dental Injury: Teeth and Oropharynx as per pre-operative assessment

## 2018-05-05 LAB — RPR: RPR: NONREACTIVE

## 2018-05-06 ENCOUNTER — Encounter: Payer: Self-pay | Admitting: Obstetrics and Gynecology

## 2018-05-06 NOTE — Anesthesia Postprocedure Evaluation (Signed)
Anesthesia Post Note  Patient: Carolyn Robles  Procedure(s) Performed: HYSTEROSCOPY (N/A Vagina ) INTRAUTERINE DEVICE (IUD) REMOVAL (N/A Vagina )  Patient location during evaluation: PACU Anesthesia Type: General Level of consciousness: awake and alert Pain management: pain level controlled Vital Signs Assessment: post-procedure vital signs reviewed and stable Respiratory status: spontaneous breathing, nonlabored ventilation and respiratory function stable Cardiovascular status: blood pressure returned to baseline and stable Postop Assessment: no apparent nausea or vomiting Anesthetic complications: no     Last Vitals:  Vitals:   05/04/18 0854 05/04/18 0917  BP: (!) 95/53 (!) 101/43  Pulse: (!) 55 (!) 51  Resp: 18 18  Temp: 36.6 C   SpO2: 100% 100%    Last Pain:  Vitals:   05/05/18 0851  TempSrc:   PainSc: 0-No pain                 Alphonsus Sias

## 2018-05-11 ENCOUNTER — Encounter: Payer: Self-pay | Admitting: Obstetrics and Gynecology

## 2018-05-26 ENCOUNTER — Encounter: Payer: Self-pay | Admitting: Obstetrics and Gynecology

## 2018-05-26 ENCOUNTER — Ambulatory Visit (INDEPENDENT_AMBULATORY_CARE_PROVIDER_SITE_OTHER): Payer: 59 | Admitting: Obstetrics and Gynecology

## 2018-05-26 VITALS — BP 106/71 | HR 71 | Ht 66.0 in | Wt 178.9 lb

## 2018-05-26 DIAGNOSIS — Z9889 Other specified postprocedural states: Secondary | ICD-10-CM

## 2018-05-26 DIAGNOSIS — N6452 Nipple discharge: Secondary | ICD-10-CM

## 2018-05-26 DIAGNOSIS — Z09 Encounter for follow-up examination after completed treatment for conditions other than malignant neoplasm: Secondary | ICD-10-CM

## 2018-05-26 DIAGNOSIS — T8332XD Displacement of intrauterine contraceptive device, subsequent encounter: Secondary | ICD-10-CM

## 2018-05-26 NOTE — Progress Notes (Signed)
Chief complaint: 1.  Postop check 2.  Status post hysteroscopic removal of IUD  Patient presents for postop check following surgery on 05/04/2018.  She did well postoperatively without any significant pelvic pain or vaginal bleeding.  She did not have any need for narcotic analgesics.  Bowel and bladder function are normal.  Findings from surgery were reviewed with the patient.  Findings from preoperative ultrasound were also discussed.  OBJECTIVE: BP 106/71   Pulse 71   Ht 5\' 6"  (1.676 m)   Wt 178 lb 14.4 oz (81.1 kg)   BMI 28.88 kg/m  Pleasant well-appearing female no acute distress.  Alert and oriented. Abdomen: Soft, nontender Pelvic exam: External genitalia-normal BUS-normal Vagina-normal Cervix-no lesions; no cervical motion tenderness; no defects are appreciated on palpation of the cervix. Uterus-midplane, mobile, nontender, nonenlarged Adnexa-nonpalpable nontender.and nonpalpable  Note: This dictation was prepared with Dragon dictation along with smaller phrase technology. Any transcriptional errors that result from this process are unintentional.   Rectovaginal-normal external exam  ASSESSMENT: 1.  Normal postop check status post hysteroscopic removal of embedded IUD 2.  Ultrasound notable for corpus luteum cyst 3.  Findings from surgery and ultrasound were reviewed  PLAN: 1.  Resume all activities without restriction 2.  Husband to get vasectomy 3.  Recommend multivitamins daily with folic acid 0.4 mg in order to lower risk for spina bifida in offspring if pregnancy occurs 4.  Patient is open to pregnancy if she conceives prior to vasectomy 5.  Follow-up as needed.  Brayton Mars, MD  Note: This dictation was prepared with Dragon dictation along with smaller phrase technology. Any transcriptional errors that result from this process are unintentional.

## 2018-05-26 NOTE — Patient Instructions (Signed)
1.  Resume all activities without restriction 2.  Recommend multivitamin daily while fertile 3.  Follow-up as needed

## 2018-06-01 NOTE — Addendum Note (Signed)
Addended by: Elouise Munroe on: 06/01/2018 01:59 PM   Modules accepted: Orders

## 2018-06-02 ENCOUNTER — Other Ambulatory Visit: Payer: 59

## 2018-06-02 DIAGNOSIS — Z09 Encounter for follow-up examination after completed treatment for conditions other than malignant neoplasm: Secondary | ICD-10-CM

## 2018-06-03 LAB — PROLACTIN: Prolactin: 6.4 ng/mL (ref 4.8–23.3)

## 2018-07-16 DIAGNOSIS — H5213 Myopia, bilateral: Secondary | ICD-10-CM | POA: Diagnosis not present

## 2018-08-18 ENCOUNTER — Ambulatory Visit
Admission: EM | Admit: 2018-08-18 | Discharge: 2018-08-18 | Disposition: A | Discharge status: Home / Self Care | Payer: 59 | Attending: Family Medicine | Admitting: Family Medicine

## 2018-08-18 ENCOUNTER — Encounter: Payer: Self-pay | Admitting: Emergency Medicine

## 2018-08-18 ENCOUNTER — Other Ambulatory Visit: Payer: Self-pay

## 2018-08-18 DIAGNOSIS — J069 Acute upper respiratory infection, unspecified: Secondary | ICD-10-CM | POA: Diagnosis not present

## 2018-08-18 LAB — RAPID STREP SCREEN (MED CTR MEBANE ONLY): Streptococcus, Group A Screen (Direct): NEGATIVE

## 2018-08-18 MED ORDER — LIDOCAINE VISCOUS HCL 2 % MT SOLN: OROMUCOSAL | 0 refills | Status: DC

## 2018-08-18 NOTE — Discharge Instructions (Addendum)
Strep negative.  This is viral.  Tylenol and ibuprofen for pain.   Sudafed for the ear pressure.  Use the viscous lidocaine for the sore throat.  Take care  Dr. Lacinda Axon

## 2018-08-18 NOTE — ED Triage Notes (Signed)
Patient c/o sore throat, fever and ear pain that started yesterday. Patient has tried Nyquil OTC last night without any relief.

## 2018-08-18 NOTE — ED Provider Notes (Signed)
[facility]  CSN: [id number] Arrival date & time: 08/18/18  0815  History   Chief Complaint Chief Complaint  Patient presents with  . Sore Throat  . Fever   HPI  32 year old female presents with the above complaints.  Started abruptly yesterday.  Patient reports sore throat, ear pressure, nausea, lightheadedness.  She reports fever.  She has a mildly elevated temperature of 99.2 today.  No documented fever.  Patient is taking NyQuil over-the-counter without resolution.  Patient also states that she "coughed up a ball of infection".  No other medications or interventions tried.  No other associated symptoms.  No other complaints.  PMH, Surgical Hx, Family Hx, Social History reviewed and updated as below.  Past Medical History:  Diagnosis Date  . Asthma    seasonal   Patient Active Problem List   Diagnosis Date Noted  . CIN III (cervical intraepithelial neoplasia grade III) with severe dysplasia 02/27/2018   Past Surgical History:  Procedure Laterality Date  . HYSTEROSCOPY N/A 05/04/2018   Procedure: HYSTEROSCOPY;  Surgeon: Defrancesco, Alanda Slim, MD;  Location: [facility] ORS;  Service: Gynecology;  Laterality: N/A;  . IUD REMOVAL N/A 05/04/2018   Procedure: INTRAUTERINE DEVICE (IUD) REMOVAL;  Surgeon: Brayton Mars, MD;  Location: [facility] ORS;  Service: Gynecology;  Laterality: N/A;  . NO PAST SURGERIES     OB History    Gravida  2   Para  2   Term  2   Preterm  0   AB  0   Living  2     SAB  0   TAB  0   Ectopic  0   Multiple  0   Live Births  2          Home Medications    Prior to Admission medications   Medication Sig Start Date End Date Taking? Authorizing Provider  levocetirizine (XYZAL) 5 MG tablet Take 5 mg by mouth every evening.   Yes [provider]  fluticasone (FLONASE) 50 MCG/ACT nasal spray Place 1 spray into both nostrils daily as needed for allergies or rhinitis.    [provider]  lidocaine (XYLOCAINE)  2 % solution Gargle 15 mL every 3 hours as needed. May swallow if desired. 08/18/18   Coral Spikes, DO   Family History Family History  Problem Relation Age of Onset  . Heart disease Mother   . Diabetes Father    Social History Social History   Tobacco Use  . Smoking status: Never Smoker  . Smokeless tobacco: Never Used  Substance Use Topics  . Alcohol use: No  . Drug use: No   Allergies   Patient has no known allergies.  Review of Systems Review of Systems  Constitutional: Positive for fever.  HENT: Positive for ear pain and sore throat.   Gastrointestinal: Positive for nausea.  Neurological: Positive for light-headedness.   Physical Exam Triage Vital Signs ED Triage Vitals  Enc Vitals Group     BP 08/18/18 0825 95/68     Pulse Rate 08/18/18 0825 80     Resp 08/18/18 0825 18     Temp 08/18/18 0825 99.2 F (37.3 C)     Temp Source 08/18/18 0825 Oral     SpO2 08/18/18 0825 99 %     Weight 08/18/18 0822 174 lb (78.9 kg)     Height 08/18/18 0822 5\' 6"  (1.676 m)     Head Circumference --      Peak Flow --  Pain Score 08/18/18 0821 5     Pain Loc --      Pain Edu? --      Excl. in [place]? --    Updated Vital Signs BP 95/68 (BP Location: Left Arm)   Pulse 80   Temp 99.2 F (37.3 C) (Oral)   Resp 18   Ht 5\' 6"  (1.676 m)   Wt 78.9 kg   LMP 07/29/2018   SpO2 99%   BMI 28.08 kg/m   Visual Acuity Right Eye Distance:   Left Eye Distance:   Bilateral Distance:    Right Eye Near:   Left Eye Near:    Bilateral Near:     Physical Exam  Constitutional: She is oriented to person, place, and time. She appears well-developed. No distress.  HENT:  Head: Normocephalic and atraumatic.  Mouth/Throat: Oropharynx is clear and moist.  Normal TM's bilaterally.   Eyes: Conjunctivae are normal. Right eye exhibits no discharge. Left eye exhibits no discharge.  Neck: Neck supple.  Cardiovascular: Normal rate and regular rhythm.  Pulmonary/Chest: Effort normal and breath  sounds normal. She has no wheezes. She has no rales.  Lymphadenopathy:    She has no cervical adenopathy.  Neurological: She is alert and oriented to person, place, and time.  Psychiatric: She has a normal mood and affect. Her behavior is normal.  Nursing note and vitals reviewed.  UC Treatments / Results  Labs (all labs ordered are listed, but only abnormal results are displayed) Labs Reviewed  RAPID STREP SCREEN ([facility] ONLY)  CULTURE, GROUP A STREP [facility])    EKG None  Radiology No results found.  Procedures Procedures (including critical care time)  Medications Ordered in UC Medications - No data to display  Initial Impression / Assessment and Plan / UC Course  I have reviewed the triage vital signs and the nursing notes.  Pertinent labs & imaging results that were available during my care of the patient were reviewed by me and considered in my medical decision making (see chart for details).    32 year old female presents with a viral URI.  Patient requesting a Z-Pak.  I informed her that this is not indicated as it is viral in origin.  Strep was negative.  Advised Tylenol and ibuprofen for pain.  Sudafed for ear congestion/pressure.  Viscous lidocaine for sore throat.  Final Clinical Impressions(s) / UC Diagnoses   Final diagnoses:  Viral upper respiratory tract infection     Discharge Instructions     Strep negative.  This is viral.  Tylenol and ibuprofen for pain.   Sudafed for the ear pressure.  Use the viscous lidocaine for the sore throat.  Take care  Dr. Lacinda Axon    ED Prescriptions    Medication Sig Dispense Auth. Provider   lidocaine (XYLOCAINE) 2 % solution Gargle 15 mL every 3 hours as needed. May swallow if desired. 200 mL Coral Spikes, DO     Controlled Substance Prescriptions [place] Controlled Substance Registry consulted? Not Applicable   Coral Spikes, [place] 08/18/18 6979

## 2018-08-20 ENCOUNTER — Encounter: Payer: 59 | Admitting: Obstetrics and Gynecology

## 2018-08-20 DIAGNOSIS — D229 Melanocytic nevi, unspecified: Secondary | ICD-10-CM | POA: Diagnosis not present

## 2018-08-20 DIAGNOSIS — L309 Dermatitis, unspecified: Secondary | ICD-10-CM | POA: Diagnosis not present

## 2018-08-20 DIAGNOSIS — L700 Acne vulgaris: Secondary | ICD-10-CM | POA: Diagnosis not present

## 2018-08-20 DIAGNOSIS — Z1283 Encounter for screening for malignant neoplasm of skin: Secondary | ICD-10-CM | POA: Diagnosis not present

## 2018-08-20 DIAGNOSIS — L814 Other melanin hyperpigmentation: Secondary | ICD-10-CM | POA: Diagnosis not present

## 2018-08-20 LAB — CULTURE, GROUP A STREP (THRC)

## 2018-08-27 ENCOUNTER — Other Ambulatory Visit: Payer: Self-pay

## 2018-08-27 MED ORDER — AZITHROMYCIN 250 MG PO TABS: ORAL_TABLET | ORAL | 0 refills | Status: DC

## 2018-09-22 ENCOUNTER — Encounter: Payer: Self-pay | Admitting: Obstetrics and Gynecology

## 2018-09-22 ENCOUNTER — Ambulatory Visit (INDEPENDENT_AMBULATORY_CARE_PROVIDER_SITE_OTHER): Payer: 59 | Admitting: Obstetrics and Gynecology

## 2018-09-22 ENCOUNTER — Other Ambulatory Visit (HOSPITAL_COMMUNITY)
Admission: RE | Admit: 2018-09-22 | Discharge: 2018-09-22 | Disposition: A | Discharge status: Home / Self Care | Payer: 59 | Source: Ambulatory Visit | Attending: Obstetrics and Gynecology | Admitting: Obstetrics and Gynecology

## 2018-09-22 VITALS — BP 104/74 | HR 67 | Ht 66.0 in | Wt 184.3 lb

## 2018-09-22 DIAGNOSIS — D061 Carcinoma in situ of exocervix: Secondary | ICD-10-CM | POA: Insufficient documentation

## 2018-09-22 NOTE — Progress Notes (Signed)
PT is present today for a colposcopy. Pt stated that she is doing well no complaints.

## 2018-09-22 NOTE — Progress Notes (Signed)
    GYNECOLOGY CLINIC COLPOSCOPY PROCEDURE NOTE  32 y.o. [id number] here for colposcopy follow up for CIN III (noted on LEEP performed 03/2018).  She has a history of ASC-H pap smear on 01/21/2018.  Discussed role for HPV in cervical dysplasia, need for continued surveillance.  Patient given informed consent, signed copy in the chart, time out was performed.  Placed in lithotomy position. Cervix viewed with speculum and colposcope after application of acetic acid.   Colposcopy adequate? Yes  no visible lesions, no mosaicism, no punctation and no abnormal vasculature.  Mild ectropion noted; no corresponding biopsies obtained.  ECC specimen obtained. All specimens were labeled and sent to pathology.   Patient was given post procedure instructions.  Will follow up pathology and manage accordingly; patient will be contacted with results.  Recommend repeat colposcopy and pap smear in 4-6 months. and recommendations.  Routine preventative health maintenance measures emphasized.     [name], MD [facility]

## 2018-09-25 DIAGNOSIS — R4189 Other symptoms and signs involving cognitive functions and awareness: Secondary | ICD-10-CM | POA: Diagnosis not present

## 2018-09-25 DIAGNOSIS — E538 Deficiency of other specified B group vitamins: Secondary | ICD-10-CM | POA: Diagnosis not present

## 2018-09-25 DIAGNOSIS — R42 Dizziness and giddiness: Secondary | ICD-10-CM | POA: Diagnosis not present

## 2018-09-25 DIAGNOSIS — R410 Disorientation, unspecified: Secondary | ICD-10-CM | POA: Diagnosis not present

## 2018-09-25 DIAGNOSIS — H538 Other visual disturbances: Secondary | ICD-10-CM | POA: Diagnosis not present

## 2018-09-29 ENCOUNTER — Other Ambulatory Visit: Payer: Self-pay | Admitting: Neurology

## 2018-09-29 DIAGNOSIS — R4189 Other symptoms and signs involving cognitive functions and awareness: Secondary | ICD-10-CM

## 2018-10-05 ENCOUNTER — Ambulatory Visit
Admission: RE | Admit: 2018-10-05 | Discharge: 2018-10-05 | Disposition: A | Discharge status: Home / Self Care | Payer: 59 | Source: Ambulatory Visit | Attending: Neurology | Admitting: Neurology

## 2018-10-05 DIAGNOSIS — R410 Disorientation, unspecified: Secondary | ICD-10-CM | POA: Diagnosis not present

## 2018-10-05 DIAGNOSIS — R4189 Other symptoms and signs involving cognitive functions and awareness: Secondary | ICD-10-CM | POA: Diagnosis not present

## 2018-10-05 DIAGNOSIS — R413 Other amnesia: Secondary | ICD-10-CM | POA: Diagnosis not present

## 2018-10-05 MED ORDER — GADOBUTROL 1 MMOL/ML IV SOLN
8.5000 mL | Freq: Once | INTRAVENOUS | Status: AC | PRN
  Administered 2018-10-05: 8.5 mL via INTRAVENOUS

## 2018-10-19 ENCOUNTER — Telehealth: Payer: 59 | Admitting: Family Medicine

## 2018-10-19 ENCOUNTER — Encounter: Payer: Self-pay | Admitting: Family Medicine

## 2018-10-19 ENCOUNTER — Ambulatory Visit: Payer: 59

## 2018-10-19 DIAGNOSIS — J029 Acute pharyngitis, unspecified: Secondary | ICD-10-CM

## 2018-10-19 NOTE — Progress Notes (Signed)
We are sorry that you are not feeling well.  Here is how we plan to help!  Your symptoms indicate a likely viral infection (Pharyngitis).   Pharyngitis is inflammation in the back of the throat which can cause a sore throat, scratchiness and sometimes difficulty swallowing.   Pharyngitis is typically caused by a respiratory virus and will just run its course.  Please keep in mind that your symptoms could last up to 10 days.  For throat pain, we recommend over the counter oral pain relief medications such as acetaminophen or aspirin, or anti-inflammatory medications such as ibuprofen or naproxen sodium.  Topical treatments such as oral throat lozenges or sprays may be used as needed.  Avoid close contact with loved ones, especially the very young and elderly.  Remember to wash your hands thoroughly throughout the day as this is the number one way to prevent the spread of infection and wipe down door knobs and counters with disinfectant.  After careful review of your answers, I would not recommend and antibiotic for your condition.  Antibiotics should not be used to treat conditions that we suspect are caused by viruses like the virus that causes the common cold or flu. However, some people can have Strep with atypical symptoms. You may need formal testing in clinic or office to confirm if your symptoms continue or worsen.  I would recommend continuing he Tylenol as needed, warm salt water rinses, and over the counter cough medication as needed. If fever develops or symptoms change or worsen please seek face to face evaluation.  Providers prescribe antibiotics to treat infections caused by bacteria. Antibiotics are very powerful in treating bacterial infections when they are used properly.  To maintain their effectiveness, they should be used only when necessary.  Overuse of antibiotics has resulted in the development of super bugs that are resistant to treatment!    Home Care:  Only take medications as  instructed by your medical team.  Do not drink alcohol while taking these medications.  A steam or ultrasonic humidifier can help congestion.  You can place a towel over your head and breathe in the steam from hot water coming from a faucet.  Avoid close contacts especially the very young and the elderly.  Cover your mouth when you cough or sneeze.  Always remember to wash your hands.  Get Help Right Away If:  You develop worsening fever or throat pain.  You develop a severe head ache or visual changes.  Your symptoms persist after you have completed your treatment plan.  Make sure you  Understand these instructions.  Will watch your condition.  Will get help right away if you are not doing well or get worse.  Your e-visit answers were reviewed by a board certified advanced clinical practitioner to complete your personal care plan.  Depending on the condition, your plan could have included both over the counter or prescription medications.  If there is a problem please reply  once you have received a response from your provider.  Your safety is important to [place].  If you have drug allergies check your prescription carefully.    You can use MyChart to ask questions about todays visit, request a non-urgent call back, or ask for a work or school excuse for 24 hours related to this e-Visit. If it has been greater than 24 hours you will need to follow up with your provider, or enter a new e-Visit to address those concerns.  You will get an  e-mail in the next two days asking about your experience.  I hope that your e-visit has been valuable and will speed your recovery. Thank you for using e-visits.

## 2018-10-22 ENCOUNTER — Encounter: Payer: 59 | Admitting: Obstetrics and Gynecology

## 2018-10-26 DIAGNOSIS — R6889 Other general symptoms and signs: Secondary | ICD-10-CM | POA: Diagnosis not present

## 2018-10-26 DIAGNOSIS — R509 Fever, unspecified: Secondary | ICD-10-CM | POA: Diagnosis not present

## 2018-10-26 DIAGNOSIS — J069 Acute upper respiratory infection, unspecified: Secondary | ICD-10-CM | POA: Diagnosis not present

## 2019-01-07 ENCOUNTER — Telehealth: Payer: Self-pay | Admitting: Obstetrics and Gynecology

## 2019-01-07 ENCOUNTER — Encounter: Payer: Self-pay | Admitting: Obstetrics and Gynecology

## 2019-01-07 ENCOUNTER — Ambulatory Visit (INDEPENDENT_AMBULATORY_CARE_PROVIDER_SITE_OTHER): Payer: 59 | Admitting: Obstetrics and Gynecology

## 2019-01-07 ENCOUNTER — Other Ambulatory Visit: Payer: Self-pay

## 2019-01-07 VITALS — BP 104/74 | HR 67 | Ht 66.0 in | Wt 184.0 lb

## 2019-01-07 DIAGNOSIS — F419 Anxiety disorder, unspecified: Secondary | ICD-10-CM

## 2019-01-07 DIAGNOSIS — G479 Sleep disorder, unspecified: Secondary | ICD-10-CM | POA: Diagnosis not present

## 2019-01-07 DIAGNOSIS — R12 Heartburn: Secondary | ICD-10-CM

## 2019-01-07 MED ORDER — FLUOXETINE HCL 10 MG PO CAPS: 10.0000 mg | ORAL_CAPSULE | Freq: Every day | ORAL | 3 refills | Status: DC

## 2019-01-07 MED ORDER — ALPRAZOLAM 0.5 MG PO TABS: 0.5000 mg | ORAL_TABLET | Freq: Three times a day (TID) | ORAL | 0 refills | Status: DC | PRN

## 2019-01-07 NOTE — Telephone Encounter (Signed)
The patient called and stated that she is wanting to schedule a Telephone visit for Anxiety. Pt informed that Nurse will review and approve appointment. Please advise.

## 2019-01-07 NOTE — Telephone Encounter (Signed)
OK to do vit Teleconf.

## 2019-01-07 NOTE — Telephone Encounter (Signed)
pls advise

## 2019-01-07 NOTE — Patient Instructions (Signed)

## 2019-01-07 NOTE — Progress Notes (Signed)
Virtual Visit via Telephone Note  I connected with Carolyn Robles on 01/07/19 at  2:45 PM EDT by telephone and verified that I am speaking with the correct person using two identifiers.   I discussed the limitations, risks, security and privacy concerns of performing an evaluation and management service by telephone and the availability of in person appointments. I also discussed with the patient that there may be a patient responsible charge related to this service. The patient expressed understanding and agreed to proceed.   History of Present Illness: States feeling very anxious and is having more panic attacks. 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 Has 2 small children at home.  Has been able to talk herself down in past, but can't now.  Hasn't slept in two days due to anxiety and acid reflux.   Has never taken meds for anxiety.   GAD 7 : Generalized Anxiety Score 01/07/2019  Nervous, Anxious, on Edge 3  Control/stop worrying 3  Worry too much - different things 3  Trouble relaxing 3  Restless 2  Easily annoyed or irritable 3  Afraid - awful might happen 2  Total GAD 7 Score 19  Anxiety Difficulty Very difficult    Observations/Objective: Oriented x4 Anxious when discussing symptoms  Assessment and Plan: Anxiety Sleep disturbance Burning reflux  Follow Up Instructions: Counseled at length regarding anxiety and it's effects on sleep and the GI system. Recommend TUMs as needed along with samples of Dexalant she is currently taking. Also recommend starting SSRI and xanax, patient consents and prescriptions for xanax 0.5mg  prn and prozac 10 mg daily sent in. To start prozac in am. Will follow up at upcoming AE next month or via MyChart as needed.    I discussed the assessment and treatment plan with the patient. The patient was provided an opportunity to ask questions and all were answered. The patient agreed with the  plan and demonstrated an understanding of the instructions.   The patient was advised to call back or seek an in-person evaluation if the symptoms worsen or if the condition fails to improve as anticipated.  I provided 15 minutes of non-face-to-face time during this encounter.   Carolyn Robles, CNM

## 2019-01-19 ENCOUNTER — Encounter: Payer: Self-pay | Admitting: *Deleted

## 2019-01-19 DIAGNOSIS — H1045 Other chronic allergic conjunctivitis: Secondary | ICD-10-CM | POA: Diagnosis not present

## 2019-01-19 DIAGNOSIS — J3081 Allergic rhinitis due to animal (cat) (dog) hair and dander: Secondary | ICD-10-CM | POA: Diagnosis not present

## 2019-01-19 DIAGNOSIS — J3089 Other allergic rhinitis: Secondary | ICD-10-CM | POA: Diagnosis not present

## 2019-01-19 DIAGNOSIS — J301 Allergic rhinitis due to pollen: Secondary | ICD-10-CM | POA: Diagnosis not present

## 2019-01-20 MED FILL — LEVOCETIRIZINE 5 MG TABLET: 5 | 30 days supply | Qty: 30 | Fill #0

## 2019-01-20 MED FILL — PROAIR HFA 90 MCG INHALER: 108 (90 BAS | 25 days supply | Qty: 9 | Fill #0

## 2019-01-26 ENCOUNTER — Encounter: Payer: 59 | Admitting: Obstetrics and Gynecology

## 2019-01-28 ENCOUNTER — Encounter: Payer: 59 | Admitting: Obstetrics and Gynecology

## 2019-01-29 DIAGNOSIS — J301 Allergic rhinitis due to pollen: Secondary | ICD-10-CM | POA: Diagnosis not present

## 2019-02-01 DIAGNOSIS — J3089 Other allergic rhinitis: Secondary | ICD-10-CM | POA: Diagnosis not present

## 2019-02-01 DIAGNOSIS — J3081 Allergic rhinitis due to animal (cat) (dog) hair and dander: Secondary | ICD-10-CM | POA: Diagnosis not present

## 2019-02-09 DIAGNOSIS — J3081 Allergic rhinitis due to animal (cat) (dog) hair and dander: Secondary | ICD-10-CM | POA: Diagnosis not present

## 2019-02-09 DIAGNOSIS — J3089 Other allergic rhinitis: Secondary | ICD-10-CM | POA: Diagnosis not present

## 2019-02-09 DIAGNOSIS — J301 Allergic rhinitis due to pollen: Secondary | ICD-10-CM | POA: Diagnosis not present

## 2019-03-23 ENCOUNTER — Telehealth: Payer: Self-pay | Admitting: *Deleted

## 2019-03-23 NOTE — Telephone Encounter (Signed)
Coronavirus (COVID-19) Are you at risk?  Are you at risk for the Coronavirus (COVID-19)?  To be considered HIGH RISK for Coronavirus (COVID-19), you have to meet the following criteria:  . Traveled to [place], [place], [place], [place] or [place]; or in the [place] to [place], [place], [place], or [place]; and have fever, cough, and shortness of breath within the last 2 weeks of travel OR . Been in close contact with a person diagnosed with COVID-19 within the last 2 weeks and have fever, cough, and shortness of breath . IF YOU DO NOT MEET THESE CRITERIA, YOU ARE CONSIDERED LOW RISK FOR COVID-19.  What to do if you are HIGH RISK for COVID-19?  . If you are having a medical emergency, call 911. . Seek medical care right away. Before you go to a doctor's office, urgent care or emergency department, call ahead and tell them about your recent travel, contact with someone diagnosed with COVID-19, and your symptoms. You should receive instructions from your physician's office regarding next steps of care.  . When you arrive at healthcare provider, tell the healthcare staff immediately you have returned from visiting [place], [place], [place], [place] or [place]; or traveled in the [place] to [place], [place], [place], or [place]; in the last two weeks or you have been in close contact with a person diagnosed with COVID-19 in the last 2 weeks.   . Tell the health care staff about your symptoms: fever, cough and shortness of breath. . After you have been seen by a medical provider, you will be either: o Tested for (COVID-19) and discharged home on quarantine except to seek medical care if symptoms worsen, and asked to  - Stay home and avoid contact with others until you get your results (4-5 days)  - Avoid travel on public transportation if possible (such as bus, train, or airplane) or o Sent to the Emergency Department by EMS for evaluation, COVID-19 testing, and possible  admission depending on your condition and test results.  What to do if you are LOW RISK for COVID-19?  Reduce your risk of any infection by using the same precautions used for avoiding the common cold or flu:  . Wash your hands often with soap and warm water for at least 20 seconds.  If soap and water are not readily available, use an alcohol-based hand sanitizer with at least 60% alcohol.  . If coughing or sneezing, cover your mouth and nose by coughing or sneezing into the elbow areas of your shirt or coat, into a tissue or into your sleeve (not your hands). . Avoid shaking hands with others and consider head nods or verbal greetings only. . Avoid touching your eyes, nose, or mouth with unwashed hands.  . Avoid close contact with people who are sick. . Avoid places or events with large numbers of people in one location, like concerts or sporting events. . Carefully consider travel plans you have or are making. . If you are planning any travel outside or inside the [place], visit the CDC's Travelers' Health webpage for the latest health notices. . If you have some symptoms but not all symptoms, continue to monitor at home and seek medical attention if your symptoms worsen. . If you are having a medical emergency, call 911.   ADDITIONAL HEALTHCARE OPTIONS FOR PATIENTS  [facility] / e-Visit: [email or web address]         [facility]: [phone]  [facility]: [phone]                   [facility]: [phone]   Spoke with pt denies any sx.  Amy Clontz, CMA 

## 2019-03-24 ENCOUNTER — Other Ambulatory Visit: Payer: Self-pay

## 2019-03-24 ENCOUNTER — Other Ambulatory Visit (HOSPITAL_COMMUNITY)
Admission: RE | Admit: 2019-03-24 | Discharge: 2019-03-24 | Disposition: A | Discharge status: Home / Self Care | Payer: 59 | Source: Ambulatory Visit | Attending: Obstetrics and Gynecology | Admitting: Obstetrics and Gynecology

## 2019-03-24 ENCOUNTER — Encounter: Payer: 59 | Admitting: Obstetrics and Gynecology

## 2019-03-24 ENCOUNTER — Ambulatory Visit (INDEPENDENT_AMBULATORY_CARE_PROVIDER_SITE_OTHER): Payer: 59 | Admitting: Obstetrics and Gynecology

## 2019-03-24 ENCOUNTER — Encounter: Payer: Self-pay | Admitting: Obstetrics and Gynecology

## 2019-03-24 VITALS — BP 103/59 | HR 70 | Ht 66.0 in | Wt 182.9 lb

## 2019-03-24 DIAGNOSIS — Z01419 Encounter for gynecological examination (general) (routine) without abnormal findings: Secondary | ICD-10-CM | POA: Diagnosis not present

## 2019-03-24 MED ORDER — CYCLOBENZAPRINE HCL 5 MG PO TABS: 5.0000 mg | ORAL_TABLET | Freq: Three times a day (TID) | ORAL | 2 refills | Status: DC | PRN

## 2019-03-24 NOTE — Patient Instructions (Signed)
 Preventive Care 18-39 Years, Female Preventive care refers to lifestyle choices and visits with your health care provider that can promote health and wellness. What does preventive care include?   A yearly physical exam. This is also called an annual well check.  Dental exams once or twice a year.  Routine eye exams. Ask your health care provider how often you should have your eyes checked.  Personal lifestyle choices, including: ? Daily care of your teeth and gums. ? Regular physical activity. ? Eating a healthy diet. ? Avoiding tobacco and drug use. ? Limiting alcohol use. ? Practicing safe sex. ? Taking vitamin and mineral supplements as recommended by your health care provider. What happens during an annual well check? The services and screenings done by your health care provider during your annual well check will depend on your age, overall health, lifestyle risk factors, and family history of disease. Counseling Your health care provider may ask you questions about your:  Alcohol use.  Tobacco use.  Drug use.  Emotional well-being.  Home and relationship well-being.  Sexual activity.  Eating habits.  Work and work environment.  Method of birth control.  Menstrual cycle.  Pregnancy history. Screening You may have the following tests or measurements:  Height, weight, and BMI.  Diabetes screening. This is done by checking your blood sugar (glucose) after you have not eaten for a while (fasting).  Blood pressure.  Lipid and cholesterol levels. These may be checked every 5 years starting at age 20.  Skin check.  Hepatitis C blood test.  Hepatitis B blood test.  Sexually transmitted disease (STD) testing.  BRCA-related cancer screening. This may be done if you have a family history of breast, ovarian, tubal, or peritoneal cancers.  Pelvic exam and Pap test. This may be done every 3 years starting at age 21. Starting at age 30, this may be done  every 5 years if you have a Pap test in combination with an HPV test. Discuss your test results, treatment options, and if necessary, the need for more tests with your health care provider. Vaccines Your health care provider may recommend certain vaccines, such as:  Influenza vaccine. This is recommended every year.  Tetanus, diphtheria, and acellular pertussis (Tdap, Td) vaccine. You may need a Td booster every 10 years.  Varicella vaccine. You may need this if you have not been vaccinated.  HPV vaccine. If you are 26 or younger, you may need three doses over 6 months.  Measles, mumps, and rubella (MMR) vaccine. You may need at least one dose of MMR. You may also need a second dose.  Pneumococcal 13-valent conjugate (PCV13) vaccine. You may need this if you have certain conditions and were not previously vaccinated.  Pneumococcal polysaccharide (PPSV23) vaccine. You may need one or two doses if you smoke cigarettes or if you have certain conditions.  Meningococcal vaccine. One dose is recommended if you are age 19-21 years and a first-year college student living in a residence hall, or if you have one of several medical conditions. You may also need additional booster doses.  Hepatitis A vaccine. You may need this if you have certain conditions or if you travel or work in places where you may be exposed to hepatitis A.  Hepatitis B vaccine. You may need this if you have certain conditions or if you travel or work in places where you may be exposed to hepatitis B.  Haemophilus influenzae type b (Hib) vaccine. You may need this if   you have certain risk factors. Talk to your health care provider about which screenings and vaccines you need and how often you need them. This information is not intended to replace advice given to you by your health care provider. Make sure you discuss any questions you have with your health care provider. Document Released: 11/26/2001 Document Revised:  05/13/2017 Document Reviewed: 08/01/2015 Elsevier Interactive Patient Education  2019 Elsevier Inc.  

## 2019-03-24 NOTE — Progress Notes (Signed)
Subjective:   Carolyn Robles is a 33 y.o. [id number] Caucasian female here for a routine well-woman exam.  Patient's last menstrual period was 03/22/2019.    Current complaints: HAVING HEADACHES request refill on flexeril; stopped lexapro after 2 weeks as it made her aggitated, still using xanax sporadically PCP: me       does desire labs  Social History: Sexual: heterosexual Marital Status: married Living situation: with family Occupation: Transport planner for [employer] Tobacco/alcohol: no tobacco use Illicit drugs: no history of illicit drug use  The following portions of the patient's history were reviewed and updated as appropriate: allergies, current medications, past family history, past medical history, past social history, past surgical history and problem list.  Past Medical History Past Medical History:  Diagnosis Date  . Asthma    seasonal    Past Surgical History Past Surgical History:  Procedure Laterality Date  . HYSTEROSCOPY N/A 05/04/2018   Procedure: HYSTEROSCOPY;  Surgeon: Defrancesco, Alanda Slim, MD;  Location: [facility] ORS;  Service: Gynecology;  Laterality: N/A;  . IUD REMOVAL N/A 05/04/2018   Procedure: INTRAUTERINE DEVICE (IUD) REMOVAL;  Surgeon: Brayton Mars, MD;  Location: [facility] ORS;  Service: Gynecology;  Laterality: N/A;  . NO PAST SURGERIES      Gynecologic History [id number]  Patient's last menstrual period was 03/22/2019. Contraception: vasectomy Last Pap: 09/2018. Results were: abnormal   Obstetric History OB History  Gravida Para Term Preterm AB Living  2 2 2  0 0 2  SAB TAB Ectopic Multiple Live Births  0 0 0 0 2    # Outcome Date GA Lbr Len/2nd Weight Sex Delivery Anes PTL Lv  2 Term 2016   6 lb 1.8 oz (2.771 kg) M Vag-Spont   LIV  1 Term 09/22/13 [redacted]w[redacted]d  6 lb 6 oz (2.892 kg) F Vag-Spont EPI N LIV    Current Medications Current Outpatient Medications on File Prior to Visit  Medication Sig Dispense Refill  . ALPRAZolam (XANAX) 0.5 MG  tablet Take 1 tablet (0.5 mg total) by mouth 3 (three) times daily as needed for anxiety. 45 tablet 0  . levocetirizine (XYZAL) 5 MG tablet Take 5 mg by mouth every evening.    . montelukast (SINGULAIR) 10 MG tablet Take 10 mg by mouth at bedtime.    Marland Kitchen FLUoxetine (PROZAC) 10 MG capsule Take 1 capsule (10 mg total) by mouth daily. (Patient not taking: Reported on 03/24/2019) 30 capsule 3  . fluticasone (FLONASE) 50 MCG/ACT nasal spray Place 1 spray into both nostrils daily as needed for allergies or rhinitis.     No current facility-administered medications on file prior to visit.     Review of Systems Patient denies any headaches, blurred vision, shortness of breath, chest pain, abdominal pain, problems with bowel movements, urination, or intercourse.  Objective:  BP (!) 103/59   Pulse 70   Ht 5\' 6"  (1.676 m)   Wt 182 lb 14.4 oz (83 kg)   LMP 03/22/2019   BMI 29.52 kg/m  Physical Exam  General:  Well developed, well nourished, no acute distress. She is alert and oriented x3. Skin:  Warm and dry Neck:  Midline trachea, no thyromegaly or nodules Cardiovascular: Regular rate and rhythm, no murmur heard Lungs:  Effort normal, all lung fields clear to auscultation bilaterally Breasts:  No dominant palpable mass, retraction, or nipple discharge Abdomen:  Soft, non tender, no hepatosplenomegaly or masses Pelvic:  External genitalia is normal in appearance.  The vagina is normal in appearance.  The cervix is bulbous, no CMT.  Thin prep pap is done with HR HPV cotesting. Uterus is felt to be normal size, shape, and contour.  No adnexal masses or tenderness noted. Extremities:  No swelling or varicosities noted Psych:  She has a normal mood and affect  Assessment:   Healthy well-woman exam H/o abnormal pap Anxiety   Plan:  Refilled flexeril. F/U 1 year for AE, or sooner if needed   Melody Rockney Ghee, CNM

## 2019-03-24 NOTE — Addendum Note (Signed)
Addended by: Keturah Barre L on: 03/24/2019 03:21 PM   Modules accepted: Orders

## 2019-03-25 LAB — COMPREHENSIVE METABOLIC PANEL
ALT: 12 IU/L (ref 0–32)
AST: 14 IU/L (ref 0–40)
Albumin/Globulin Ratio: 1.8 (ref 1.2–2.2)
Albumin: 4.2 g/dL (ref 3.8–4.8)
Alkaline Phosphatase: 45 IU/L (ref 39–117)
BUN/Creatinine Ratio: 18 (ref 9–23)
BUN: 11 mg/dL (ref 6–20)
Bilirubin Total: <0.2 mg/dL (ref 0.0–1.2)
CO2: 23 mmol/L (ref 20–29)
Calcium: 8.9 mg/dL (ref 8.7–10.2)
Chloride: 104 mmol/L (ref 96–106)
Creatinine, Ser: 0.61 mg/dL (ref 0.57–1.00)
GFR calc Af Amer: 139 mL/min/{1.73_m2} (ref >59)
GFR calc non Af Amer: 120 mL/min/{1.73_m2} (ref >59)
Globulin, Total: 2.3 g/dL (ref 1.5–4.5)
Glucose: 133 mg/dL — ABNORMAL HIGH (ref 65–99)
Potassium: 4 mmol/L (ref 3.5–5.2)
Sodium: 139 mmol/L (ref 134–144)
Total Protein: 6.5 g/dL (ref 6.0–8.5)

## 2019-03-25 LAB — LIPID PANEL
Chol/HDL Ratio: 4.1 ratio (ref 0.0–4.4)
Cholesterol, Total: 141 mg/dL (ref 100–199)
HDL: 34 mg/dL — ABNORMAL LOW (ref >39)
LDL Calculated: 67 mg/dL (ref 0–99)
Triglycerides: 199 mg/dL — ABNORMAL HIGH (ref 0–149)
VLDL Cholesterol Cal: 40 mg/dL (ref 5–40)

## 2019-03-29 ENCOUNTER — Telehealth: Payer: Self-pay | Admitting: Obstetrics and Gynecology

## 2019-03-29 NOTE — Telephone Encounter (Signed)
The patient called and stated that she is checking the status of her results. Please advise.

## 2019-03-30 ENCOUNTER — Encounter: Payer: Self-pay | Admitting: *Deleted

## 2019-03-30 ENCOUNTER — Telehealth: Payer: Self-pay | Admitting: Obstetrics and Gynecology

## 2019-03-30 LAB — CYTOLOGY - PAP
Diagnosis: NEGATIVE
HPV: NOT DETECTED

## 2019-03-30 NOTE — Telephone Encounter (Signed)
The patient is requesting to speak with Amy in regards to her phone message that was sent back yesterday. Please advise.

## 2019-03-30 NOTE — Telephone Encounter (Signed)
Sent pt [facility]-ac

## 2019-04-01 ENCOUNTER — Encounter: Payer: Self-pay | Admitting: Obstetrics and Gynecology

## 2019-04-05 DIAGNOSIS — R509 Fever, unspecified: Secondary | ICD-10-CM | POA: Diagnosis not present

## 2019-04-05 DIAGNOSIS — Z209 Contact with and (suspected) exposure to unspecified communicable disease: Secondary | ICD-10-CM | POA: Diagnosis not present

## 2019-04-05 DIAGNOSIS — R110 Nausea: Secondary | ICD-10-CM | POA: Diagnosis not present

## 2019-04-05 DIAGNOSIS — R197 Diarrhea, unspecified: Secondary | ICD-10-CM | POA: Diagnosis not present

## 2019-04-05 DIAGNOSIS — A049 Bacterial intestinal infection, unspecified: Secondary | ICD-10-CM | POA: Diagnosis not present

## 2019-04-05 DIAGNOSIS — Z03818 Encounter for observation for suspected exposure to other biological agents ruled out: Secondary | ICD-10-CM | POA: Diagnosis not present

## 2019-04-05 DIAGNOSIS — Z3202 Encounter for pregnancy test, result negative: Secondary | ICD-10-CM | POA: Diagnosis not present

## 2019-04-05 DIAGNOSIS — R1084 Generalized abdominal pain: Secondary | ICD-10-CM | POA: Diagnosis not present

## 2019-04-07 ENCOUNTER — Encounter: Payer: 59 | Admitting: Obstetrics and Gynecology

## 2019-04-13 ENCOUNTER — Other Ambulatory Visit: Payer: Self-pay

## 2019-04-13 MED ORDER — ALPRAZOLAM 0.5 MG PO TABS: 0.5000 mg | ORAL_TABLET | Freq: Three times a day (TID) | ORAL | 2 refills | Status: DC | PRN

## 2019-04-30 DIAGNOSIS — L040 Acute lymphadenitis of face, head and neck: Secondary | ICD-10-CM | POA: Diagnosis not present

## 2019-06-30 ENCOUNTER — Other Ambulatory Visit: Payer: Self-pay | Admitting: *Deleted

## 2019-06-30 ENCOUNTER — Telehealth: Payer: Self-pay | Admitting: Obstetrics and Gynecology

## 2019-06-30 MED ORDER — METRONIDAZOLE 500 MG PO TABS: 500.0000 mg | ORAL_TABLET | Freq: Two times a day (BID) | ORAL | 0 refills | Status: DC

## 2019-06-30 NOTE — Telephone Encounter (Signed)
Pt c/o vaginal odor sent in medication

## 2019-06-30 NOTE — Telephone Encounter (Signed)
The patient called and stated that she needs to speak with Carolyn Robles. Nurse unavailable at the time. Pt requested a message be sent back for a call back. Please advise.

## 2019-07-21 DIAGNOSIS — L308 Other specified dermatitis: Secondary | ICD-10-CM | POA: Diagnosis not present

## 2019-07-21 DIAGNOSIS — L700 Acne vulgaris: Secondary | ICD-10-CM | POA: Diagnosis not present

## 2019-08-06 ENCOUNTER — Emergency Department
Admission: EM | Admit: 2019-08-06 | Discharge: 2019-08-06 | Disposition: A | Discharge status: Home / Self Care | Payer: 59 | Attending: Emergency Medicine | Admitting: Emergency Medicine

## 2019-08-06 ENCOUNTER — Encounter: Payer: Self-pay | Admitting: Intensive Care

## 2019-08-06 ENCOUNTER — Other Ambulatory Visit: Payer: Self-pay

## 2019-08-06 ENCOUNTER — Emergency Department: Payer: 59

## 2019-08-06 DIAGNOSIS — R519 Headache, unspecified: Secondary | ICD-10-CM | POA: Insufficient documentation

## 2019-08-06 DIAGNOSIS — R42 Dizziness and giddiness: Secondary | ICD-10-CM | POA: Diagnosis not present

## 2019-08-06 DIAGNOSIS — H538 Other visual disturbances: Secondary | ICD-10-CM | POA: Diagnosis not present

## 2019-08-06 DIAGNOSIS — J45909 Unspecified asthma, uncomplicated: Secondary | ICD-10-CM | POA: Insufficient documentation

## 2019-08-06 DIAGNOSIS — Z79899 Other long term (current) drug therapy: Secondary | ICD-10-CM | POA: Insufficient documentation

## 2019-08-06 DIAGNOSIS — R4182 Altered mental status, unspecified: Secondary | ICD-10-CM | POA: Diagnosis not present

## 2019-08-06 LAB — CBC
HCT: 37.1 % (ref 36.0–46.0)
Hemoglobin: 12.3 g/dL (ref 12.0–15.0)
MCH: 27.7 pg (ref 26.0–34.0)
MCHC: 33.2 g/dL (ref 30.0–36.0)
MCV: 83.6 fL (ref 80.0–100.0)
Platelets: 210 10*3/uL (ref 150–400)
RBC: 4.44 MIL/uL (ref 3.87–5.11)
RDW: 12.7 % (ref 11.5–15.5)
WBC: 6.6 10*3/uL (ref 4.0–10.5)
nRBC: 0 % (ref 0.0–0.2)

## 2019-08-06 LAB — URINALYSIS, COMPLETE (UACMP) WITH MICROSCOPIC
Bilirubin Urine: NEGATIVE
Glucose, UA: NEGATIVE mg/dL
Hgb urine dipstick: NEGATIVE
Ketones, ur: NEGATIVE mg/dL
Leukocytes,Ua: NEGATIVE
Nitrite: NEGATIVE
Protein, ur: NEGATIVE mg/dL
Specific Gravity, Urine: 1.006 (ref 1.005–1.030)
pH: 6 (ref 5.0–8.0)

## 2019-08-06 LAB — BASIC METABOLIC PANEL
Anion gap: 8 (ref 5–15)
BUN: 14 mg/dL (ref 6–20)
CO2: 25 mmol/L (ref 22–32)
Calcium: 9.1 mg/dL (ref 8.9–10.3)
Chloride: 104 mmol/L (ref 98–111)
Creatinine, Ser: 0.56 mg/dL (ref 0.44–1.00)
GFR calc Af Amer: >60 mL/min (ref >60)
GFR calc non Af Amer: >60 mL/min (ref >60)
Glucose, Bld: 103 mg/dL — ABNORMAL HIGH (ref 70–99)
Potassium: 3.8 mmol/L (ref 3.5–5.1)
Sodium: 137 mmol/L (ref 135–145)

## 2019-08-06 LAB — POCT PREGNANCY, URINE: Preg Test, Ur: NEGATIVE

## 2019-08-06 MED ORDER — IBUPROFEN 400 MG PO TABS
400.0000 mg | ORAL_TABLET | Freq: Once | ORAL | Status: AC
  Administered 2019-08-06: 16:00:00 400 mg via ORAL
  Filled 2019-08-06: qty 1

## 2019-08-06 NOTE — ED Provider Notes (Addendum)
[facility] Emergency Department Provider Note   ____________________________________________   First MD Initiated Contact with Patient 08/06/19 1429     (approximate)  I have reviewed the triage vital signs and the nursing notes.   HISTORY  Chief Complaint Dizziness and Blurred Vision    HPI Carolyn Robles is a 33 y.o. female with no significant past medical history who presents to the ED with complaints of headache and blurry vision.  Patient reports that she was started on doxycycline for acne about 1 week ago and since she started taking the medication has developed a diffuse headache as well as blurry vision.  She reports feeling dizzy and lightheaded at times, but denies any numbness, weakness, or speech changes.  She stopped taking the medication 3 days ago and felt better, but when she took doxycycline earlier today her symptoms seem to return.  She spoke with her dermatologist, who was concerned that she might be developing pseudotumor cerebri and referred her to the ED for further evaluation.  Patient denies any fevers or neck stiffness.        Past Medical History:  Diagnosis Date  . Asthma    seasonal    Patient Active Problem List   Diagnosis Date Noted  . CIN III (cervical intraepithelial neoplasia grade III) with severe dysplasia 02/27/2018    Past Surgical History:  Procedure Laterality Date  . HYSTEROSCOPY N/A 05/04/2018   Procedure: HYSTEROSCOPY;  Surgeon: Defrancesco, Alanda Slim, MD;  Location: [facility] ORS;  Service: Gynecology;  Laterality: N/A;  . IUD REMOVAL N/A 05/04/2018   Procedure: INTRAUTERINE DEVICE (IUD) REMOVAL;  Surgeon: Brayton Mars, MD;  Location: [facility] ORS;  Service: Gynecology;  Laterality: N/A;  . NO PAST SURGERIES      Prior to Admission medications   Medication Sig Start Date End Date Taking? Authorizing Provider  ALPRAZolam Duanne Moron) 0.5 MG tablet Take 1 tablet (0.5 mg total) by mouth 3 (three) times  daily as needed for anxiety. 04/13/19   Shambley, Melody N, CNM  cyclobenzaprine (FLEXERIL) 5 MG tablet Take 1 tablet (5 mg total) by mouth 3 (three) times daily as needed for muscle spasms. 03/24/19   Shambley, Melody N, CNM  FLUoxetine (PROZAC) 10 MG capsule Take 1 capsule (10 mg total) by mouth daily. Patient not taking: Reported on 03/24/2019 01/07/19   Shambley, Melody N, CNM  fluticasone (FLONASE) 50 MCG/ACT nasal spray Place 1 spray into both nostrils daily as needed for allergies or rhinitis.    [provider]  levocetirizine (XYZAL) 5 MG tablet Take 5 mg by mouth every evening.    [provider]  metroNIDAZOLE (FLAGYL) 500 MG tablet Take 1 tablet (500 mg total) by mouth 2 (two) times daily. 06/30/19   Shambley, Melody N, CNM  montelukast (SINGULAIR) 10 MG tablet Take 10 mg by mouth at bedtime.    [provider]    Allergies Patient has no known allergies.  Family History  Problem Relation Age of Onset  . Heart disease Mother   . Diabetes Father     Social History Social History   Tobacco Use  . Smoking status: Never Smoker  . Smokeless tobacco: Never Used  Substance Use Topics  . Alcohol use: Yes    Alcohol/week: 2.0 standard drinks    Types: 2 Cans of beer per week  . Drug use: No    Review of Systems  Constitutional: No fever/chills Eyes: Positive for vision change. ENT: No sore throat. Cardiovascular: Denies  chest pain. Respiratory: Denies shortness of breath. Gastrointestinal: No abdominal pain.  No nausea, no vomiting.  No diarrhea.  No constipation. Genitourinary: Negative for dysuria. Musculoskeletal: Negative for back pain. Skin: Negative for rash. Neurological: Positive for headache, negative for focal weakness or numbness.  ____________________________________________   PHYSICAL EXAM:  VITAL SIGNS: ED Triage Vitals  Enc Vitals Group     BP 08/06/19 1316 113/62     Pulse Rate 08/06/19 1316 75     Resp 08/06/19 1316 14      Temp 08/06/19 1316 98.7 F (37.1 C)     Temp Source 08/06/19 1316 Oral     SpO2 08/06/19 1316 98 %     Weight 08/06/19 1311 182 lb (82.6 kg)     Height 08/06/19 1311 5\' 6"  (1.676 m)     Head Circumference --      Peak Flow --      Pain Score 08/06/19 1315 0     Pain Loc --      Pain Edu? --      Excl. in [place]? --     Constitutional: Alert and oriented. Eyes: Conjunctivae are normal.  Pupils equal round and reactive to light bilaterally, extraocular movements intact.  No obvious papilledema. Head: Atraumatic. Nose: No congestion/rhinnorhea. Mouth/Throat: Mucous membranes are moist. Neck: Normal ROM Cardiovascular: Normal rate, regular rhythm. Grossly normal heart sounds. Respiratory: Normal respiratory effort.  No retractions. Lungs CTAB. Gastrointestinal: Soft and nontender. No distention. Genitourinary: deferred Musculoskeletal: No lower extremity tenderness nor edema. Neurologic:  Normal speech and language. No gross focal neurologic deficits are appreciated. Skin:  Skin is warm, dry and intact. No rash noted. Psychiatric: Mood and affect are normal. Speech and behavior are normal.  ____________________________________________   LABS (all labs ordered are listed, but only abnormal results are displayed)  Labs Reviewed  BASIC METABOLIC PANEL - Abnormal; Notable for the following components:      Result Value   Glucose, Bld 103 (*)    All other components within normal limits  URINALYSIS, COMPLETE (UACMP) WITH MICROSCOPIC - Abnormal; Notable for the following components:   Color, Urine STRAW (*)    APPearance CLEAR (*)    Bacteria, UA FEW (*)    All other components within normal limits  CBC  POCT PREGNANCY, URINE     PROCEDURES  Procedure(s) performed (including Critical Care):  Procedures  ED ECG REPORT I, Blake Divine, the attending physician, personally viewed and interpreted this ECG.   Date: 08/06/2019  EKG Time: 13:15  Rate: 67  Rhythm: normal  sinus rhythm  Axis: Normal  Intervals:none  ST&T Change: None  ____________________________________________   INITIAL IMPRESSION / ASSESSMENT AND PLAN / ED COURSE       33 year old female presents to the ED with worsening headaches and blurry vision after recently starting doxycycline for acne.  She has no fevers or neck stiffness, headaches are described as gradual in onset, doubt SAH or meningitis.  She has no focal neurologic deficits on exam.  Will screen head CT.  Pregnancy testing is negative, UA and labs are unremarkable.  Pseudotumor cerebri is a possible side effect for doxycycline, although patient's symptoms seem relatively mild at this time.  Her visual acuity is not significantly impaired at 20/40 in 20/25, patient has previously been told to wear glasses but does not have these today.  Will advise patient to stop taking doxycycline, do not feel lumbar puncture is indicated at this time given mild symptoms and apparent association with  doxycycline.  Head CT negative for acute process.  Symptoms remain relatively mild with no significant impairment in her vision.  Will provide patient with ophthalmology follow-up and counseled to return to the ED for new or worsening symptoms.  Patient agrees with plan.      ____________________________________________   FINAL CLINICAL IMPRESSION(S) / ED DIAGNOSES  Final diagnoses:  Acute nonintractable headache, unspecified headache type  Blurry vision, bilateral     ED Discharge Orders    None       Note:  This document was prepared using Dragon voice recognition software and may include unintentional dictation errors.   Blake Divine, MD 08/06/19 1603    Blake Divine, MD 08/06/19 [phone]

## 2019-08-06 NOTE — ED Notes (Signed)
First Nurse Note: Pt states that he MD sent her for possible reaction to med. Pt states that she has been feeling "foggy", states that she is confused, and she feels "pressure" in her brain. Pt is in no acute distress. She is answering all questions appropriately.

## 2019-08-06 NOTE — ED Notes (Signed)
PT is a&o x4. Pt ambulated at this time to visual acuity test without difficulty. MD aware of results of visual acuity.

## 2019-08-06 NOTE — ED Triage Notes (Signed)
Patient reports being placed on doxycycline the last two weeks for acne. Her pcp sent her here after she c/o dizziness, blurry vision, and head pressure. She reported her doctor is concerned for pseudotumor cerebri. Patient drove herself to ER. A&O x4 in triage. No trouble ambulating

## 2019-08-09 ENCOUNTER — Telehealth: Payer: Self-pay

## 2019-08-09 NOTE — Telephone Encounter (Signed)
Copied from [place] [phone]. Topic: Appointment Scheduling - Scheduling Inquiry for Clinic >> Aug 09, 2019  8:46 AM Lennox Solders wrote: Reason for CRM: This will be a new patient and I did not know if dr mclean-scocuzza would do new pt and post er fup in the same visit. Pt went to [facility] on 08-06-2019. Pt has umr. please call pt back. I did called office was on hold

## 2019-08-10 ENCOUNTER — Telehealth: Payer: Self-pay | Admitting: *Deleted

## 2019-08-10 NOTE — Telephone Encounter (Signed)
Copied from [place] [phone]. Topic: Quick Communication - See Telephone Encounter >> Aug 10, 2019 10:30 AM Loma Boston wrote: CRM for notification. See Telephone encounter for: 08/10/19.New pt, DR Olivia Mackie approved/ New pt h fu 08/18/19 4:00

## 2019-08-10 NOTE — Telephone Encounter (Signed)
Patient has been informed of the below.

## 2019-08-10 NOTE — Telephone Encounter (Signed)
I dont see her scheduled  sch appt  Seems like needs to see neurology   [place]

## 2019-08-10 NOTE — Telephone Encounter (Signed)
So it is ok to schedule est care with an ER FU?

## 2019-08-10 NOTE — Telephone Encounter (Signed)
Ok

## 2019-08-18 ENCOUNTER — Encounter: Payer: Self-pay | Admitting: Internal Medicine

## 2019-08-18 ENCOUNTER — Other Ambulatory Visit: Payer: Self-pay

## 2019-08-18 ENCOUNTER — Ambulatory Visit (INDEPENDENT_AMBULATORY_CARE_PROVIDER_SITE_OTHER): Payer: 59 | Admitting: Internal Medicine

## 2019-08-18 VITALS — BP 102/58 | HR 65 | Temp 97.9°F | Ht 66.0 in | Wt 185.0 lb

## 2019-08-18 DIAGNOSIS — Z1329 Encounter for screening for other suspected endocrine disorder: Secondary | ICD-10-CM

## 2019-08-18 DIAGNOSIS — E785 Hyperlipidemia, unspecified: Secondary | ICD-10-CM

## 2019-08-18 DIAGNOSIS — R7989 Other specified abnormal findings of blood chemistry: Secondary | ICD-10-CM

## 2019-08-18 DIAGNOSIS — E538 Deficiency of other specified B group vitamins: Secondary | ICD-10-CM | POA: Diagnosis not present

## 2019-08-18 DIAGNOSIS — E663 Overweight: Secondary | ICD-10-CM

## 2019-08-18 DIAGNOSIS — M2550 Pain in unspecified joint: Secondary | ICD-10-CM

## 2019-08-18 DIAGNOSIS — J4520 Mild intermittent asthma, uncomplicated: Secondary | ICD-10-CM

## 2019-08-18 DIAGNOSIS — R768 Other specified abnormal immunological findings in serum: Secondary | ICD-10-CM

## 2019-08-18 DIAGNOSIS — E559 Vitamin D deficiency, unspecified: Secondary | ICD-10-CM | POA: Diagnosis not present

## 2019-08-18 DIAGNOSIS — R410 Disorientation, unspecified: Secondary | ICD-10-CM

## 2019-08-18 DIAGNOSIS — Z13220 Encounter for screening for lipoid disorders: Secondary | ICD-10-CM

## 2019-08-18 DIAGNOSIS — R600 Localized edema: Secondary | ICD-10-CM | POA: Diagnosis not present

## 2019-08-18 DIAGNOSIS — R739 Hyperglycemia, unspecified: Secondary | ICD-10-CM | POA: Diagnosis not present

## 2019-08-18 MED ORDER — PHENTERMINE HCL 37.5 MG PO TABS: 18.2500 mg | ORAL_TABLET | Freq: Every day | ORAL | 0 refills | Status: DC

## 2019-08-18 MED ORDER — ALBUTEROL SULFATE HFA 108 (90 BASE) MCG/ACT IN AERS: 1.0000 | INHALATION_SPRAY | Freq: Four times a day (QID) | RESPIRATORY_TRACT | 12 refills | Status: DC | PRN

## 2019-08-18 NOTE — Patient Instructions (Addendum)
Dyshidrotic eczema   Recommend eye exam [facility]   The next 56 days online nutrition program   Premier protein shake with banana add chia or flax seeds   Consider HPV vaccine with ob/gyn   Exercising to Lose Weight Exercise is structured, repetitive physical activity to improve fitness and health. Getting regular exercise is important for everyone. It is especially important if you are overweight. Being overweight increases your risk of heart disease, stroke, diabetes, high blood pressure, and several types of cancer. Reducing your calorie intake and exercising can help you lose weight. Exercise is usually categorized as moderate or vigorous intensity. To lose weight, most people need to do a certain amount of moderate-intensity or vigorous-intensity exercise each week. Moderate-intensity exercise  Moderate-intensity exercise is any activity that gets you moving enough to burn at least three times more energy (calories) than if you were sitting. Examples of moderate exercise include:  Walking a mile in 15 minutes.  Doing light yard work.  Biking at an easy pace. Most people should get at least 150 minutes (2 hours and 30 minutes) a week of moderate-intensity exercise to maintain their body weight. Vigorous-intensity exercise Vigorous-intensity exercise is any activity that gets you moving enough to burn at least six times more calories than if you were sitting. When you exercise at this intensity, you should be working hard enough that you are not able to carry on a conversation. Examples of vigorous exercise include:  Running.  Playing a team sport, such as football, basketball, and soccer.  Jumping rope. Most people should get at least 75 minutes (1 hour and 15 minutes) a week of vigorous-intensity exercise to maintain their body weight. How can exercise affect me? When you exercise enough to burn more calories than you eat, you lose weight. Exercise also reduces body fat and  builds muscle. The more muscle you have, the more calories you burn. Exercise also:  Improves mood.  Reduces stress and tension.  Improves your overall fitness, flexibility, and endurance.  Increases bone strength. The amount of exercise you need to lose weight depends on:  Your age.  The type of exercise.  Any health conditions you have.  Your overall physical ability. Talk to your health care provider about how much exercise you need and what types of activities are safe for you. What actions can I take to lose weight? Nutrition   Make changes to your diet as told by your health care provider or diet and nutrition specialist (dietitian). This may include: ? Eating fewer calories. ? Eating more protein. ? Eating less unhealthy fats. ? Eating a diet that includes fresh fruits and vegetables, whole grains, low-fat dairy products, and lean protein. ? Avoiding foods with added fat, salt, and sugar.  Drink plenty of water while you exercise to prevent dehydration or heat stroke. Activity  Choose an activity that you enjoy and set realistic goals. Your health care provider can help you make an exercise plan that works for you.  Exercise at a moderate or vigorous intensity most days of the week. ? The intensity of exercise may vary from person to person. You can tell how intense a workout is for you by paying attention to your breathing and heartbeat. Most people will notice their breathing and heartbeat get faster with more intense exercise.  Do resistance training twice each week, such as: ? Push-ups. ? Sit-ups. ? Lifting weights. ? Using resistance bands.  Getting short amounts of exercise can be just as helpful  as long structured periods of exercise. If you have trouble finding time to exercise, try to include exercise in your daily routine. ? Get up, stretch, and walk around every 30 minutes throughout the day. ? Go for a walk during your lunch break. ? Park your car  farther away from your destination. ? If you take public transportation, get off one stop early and walk the rest of the way. ? Make phone calls while standing up and walking around. ? Take the stairs instead of elevators or escalators.  Wear comfortable clothes and shoes with good support.  Do not exercise so much that you hurt yourself, feel dizzy, or get very short of breath. Where to find more information  [place] Department of Health and Human Services: [employer]  Centers for Disease Control and Prevention (CDC): http://www.wolf.info/ Contact a health care provider:  Before starting a new exercise program.  If you have questions or concerns about your weight.  If you have a medical problem that keeps you from exercising. Get help right away if you have any of the following while exercising:  Injury.  Dizziness.  Difficulty breathing or shortness of breath that does not go away when you stop exercising.  Chest pain.  Rapid heartbeat. Summary  Being overweight increases your risk of heart disease, stroke, diabetes, high blood pressure, and several types of cancer.  Losing weight happens when you burn more calories than you eat.  Reducing the amount of calories you eat in addition to getting regular moderate or vigorous exercise each week helps you lose weight. This information is not intended to replace advice given to you by your health care provider. Make sure you discuss any questions you have with your health care provider. Document Released: 11/02/2010 Document Revised: 10/13/2017 Document Reviewed: 10/13/2017 Elsevier Patient Education  2020 [employer].   Phentermine tablets or capsules What is this medicine? PHENTERMINE (FEN ter meen) decreases your appetite. It is used with a reduced calorie diet and exercise to help you lose weight. This medicine may be used for other purposes; ask your health care provider or pharmacist if you have questions. COMMON BRAND NAME(S):  Adipex-P, Atti-Plex P, Atti-Plex P Spansule, Fastin, Lomaira, Pro-Fast, Tara-8 What should I tell my health care provider before I take this medicine? They need to know if you have any of these conditions:  agitation or nervousness  diabetes  glaucoma  heart disease  high blood pressure  history of drug abuse or addiction  history of stroke  kidney disease  lung disease called Primary Pulmonary Hypertension (PPH)  taken an MAOI like Carbex, Eldepryl, Marplan, Nardil, or Parnate in last 14 days  taking stimulant medicines for attention disorders, weight loss, or to stay awake  thyroid disease  an unusual or allergic reaction to phentermine, other medicines, foods, dyes, or preservatives  pregnant or trying to get pregnant  breast-feeding How should I use this medicine? Take this medicine by mouth with a glass of water. Follow the directions on the prescription label. The instructions for use may differ based on the product and dose you are taking. Avoid taking this medicine in the evening. It may interfere with sleep. Take your doses at regular intervals. Do not take your medicine more often than directed. Talk to your pediatrician regarding the use of this medicine in children. While this drug may be prescribed for children 17 years or older for selected conditions, precautions do apply. Overdosage: If you think you have taken too much of this  medicine contact a poison control center or emergency room at once. NOTE: This medicine is only for you. Do not share this medicine with others. What if I miss a dose? If you miss a dose, take it as soon as you can. If it is almost time for your next dose, take only that dose. Do not take double or extra doses. What may interact with this medicine? Do not take this medicine with any of the following medications:  MAOIs like Carbex, Eldepryl, Marplan, Nardil, and Parnate  medicines for colds or breathing difficulties like  pseudoephedrine or phenylephrine  procarbazine  sibutramine  stimulant medicines for attention disorders, weight loss, or to stay awake This medicine may also interact with the following medications:  certain medicines for depression, anxiety, or psychotic disturbances  linezolid  medicines for diabetes  medicines for high blood pressure This list may not describe all possible interactions. Give your health care provider a list of all the medicines, herbs, non-prescription drugs, or dietary supplements you use. Also tell them if you smoke, drink alcohol, or use illegal drugs. Some items may interact with your medicine. What should I watch for while using this medicine? Notify your physician immediately if you become short of breath while doing your normal activities. Do not take this medicine within 6 hours of bedtime. It can keep you from getting to sleep. Avoid drinks that contain caffeine and try to stick to a regular bedtime every night. This medicine was intended to be used in addition to a healthy diet and exercise. The best results are achieved this way. This medicine is only indicated for short-term use. Eventually your weight loss may level out. At that point, the drug will only help you maintain your new weight. Do not increase or in any way change your dose without consulting your doctor. You may get drowsy or dizzy. Do not drive, use machinery, or do anything that needs mental alertness until you know how this medicine affects you. Do not stand or sit up quickly, especially if you are an older patient. This reduces the risk of dizzy or fainting spells. Alcohol may increase dizziness and drowsiness. Avoid alcoholic drinks. What side effects may I notice from receiving this medicine? Side effects that you should report to your doctor or health care professional as soon as possible:  allergic reactions like skin rash, itching or hives, swelling of the face, lips, or  tongue)  anxiety  breathing problems  changes in vision  chest pain or chest tightness  depressed mood or other mood changes  hallucinations, loss of contact with reality  fast, irregular heartbeat  increased blood pressure  irritable  nervousness or restlessness  painful urination  palpitations  tremors  trouble sleeping  seizures  signs and symptoms of a stroke like changes in vision; confusion; trouble speaking or understanding; severe headaches; sudden numbness or weakness of the face, arm or leg; trouble walking; dizziness; loss of balance or coordination  unusually weak or tired  vomiting Side effects that usually do not require medical attention (report to your doctor or health care professional if they continue or are bothersome):  constipation or diarrhea  dry mouth  headache  nausea  stomach upset  sweating This list may not describe all possible side effects. Call your doctor for medical advice about side effects. You may report side effects to FDA at 1-800-FDA-1088. Where should I keep my medicine? Keep out of the reach of children. This medicine can be abused. Keep your  medicine in a safe place to protect it from theft. Do not share this medicine with anyone. Selling or giving away this medicine is dangerous and against the law. This medicine may cause accidental overdose and death if taken by other adults, children, or pets. Mix any unused medicine with a substance like cat litter or coffee grounds. Then throw the medicine away in a sealed container like a sealed bag or a coffee can with a lid. Do not use the medicine after the expiration date. Store at room temperature between 20 and 25 degrees C (68 and 77 degrees F). Keep container tightly closed. NOTE: This sheet is a summary. It may not cover all possible information. If you have questions about this medicine, talk to your doctor, pharmacist, or health care provider.  2020 Elsevier/Gold  Standard (2017-03-14 08:23:13)

## 2019-08-18 NOTE — Progress Notes (Addendum)
Chief Complaint  Patient presents with  . Establish Care   New patient prior PCP Dr. Pati Gallo   1. H/o multiple episodes of h/a, blurry vision and confusion w/o nausea last was 08/06/2019. She does report h/o preeclampsia with pregnancy and since then has chronic blurry vision had vision checked with [facility]  Denies h/o seizures or LOC with episodes  CT scan 08/06/19 for ED visit for confusion normal. MRI 10/05/18 normal except for evidence of right maxillary sinus disease.   -has seen Dr. Manuella Ghazi [facility] neurology in the past last 10/05/18 but needs f/u appt for episodes of confusion   2. Allergies on shots 1x per week  3. C/o hand edema, leg edema and joint pain in hands will work up for autoimmune issues with h/o episodes of confusion as well   4. Overweight and wants to be back on Adipex  5. C/o rash with blistering bubbles to hands at times ? If eczema given steroid topical cream Clobetasol to help also with acne on retina and actozone helping and didn't want to try accutane 6. HLD Tgs 199 03/2019   Review of Systems  Constitutional: Negative for weight loss.  HENT: Negative for hearing loss.   Eyes: Positive for blurred vision.  Respiratory: Negative for shortness of breath.   Cardiovascular: Positive for leg swelling. Negative for chest pain.  Musculoskeletal: Positive for joint pain. Negative for falls.  Skin: Positive for rash.  Neurological: Negative for headaches.       +episodes of confusion    Psychiatric/Behavioral: Negative for depression.   Past Medical History:  Diagnosis Date  . Allergy    pollen, trees, etc   . Asthma    seasonal  . UTI (urinary tract infection)    Past Surgical History:  Procedure Laterality Date  . HYSTEROSCOPY N/A 05/04/2018   Procedure: HYSTEROSCOPY;  Surgeon: Defrancesco, Alanda Slim, MD;  Location: [facility] ORS;  Service: Gynecology;  Laterality: N/A;  . IUD REMOVAL N/A 05/04/2018   Procedure: INTRAUTERINE DEVICE (IUD) REMOVAL;  Surgeon:  Brayton Mars, MD;  Location: [facility] ORS;  Service: Gynecology;  Laterality: N/A;  . LEEP     for h/o abnormal pap  . RETAINED PLACENTA REMOVAL     with vaccum suction    Family History  Problem Relation Age of Onset  . Heart disease Mother        MI died when pt was young  . Diabetes Father   . Depression Sister   . Miscarriages / Stillbirths Sister   . Alcohol abuse Brother   . Depression Brother   . Drug abuse Brother    Social History   Socioeconomic History  . Marital status: Married    Spouse name: Not on file  . Number of children: Not on file  . Years of education: Not on file  . Highest education level: Not on file  Occupational History  . Not on file  Social Needs  . Financial resource strain: Not on file  . Food insecurity    Worry: Not on file    Inability: Not on file  . Transportation needs    Medical: Not on file    Non-medical: Not on file  Tobacco Use  . Smoking status: Never Smoker  . Smokeless tobacco: Never Used  Substance and Sexual Activity  . Alcohol use: Yes    Alcohol/week: 2.0 standard drinks    Types: 2 Cans of beer per week  . Drug use: No  . Sexual activity:  Yes    Birth control/protection: None  Lifestyle  . Physical activity    Days per week: Not on file    Minutes per session: Not on file  . Stress: Not on file  Relationships  . Social Herbalist on phone: Not on file    Gets together: Not on file    Attends religious service: Not on file    Active member of club or organization: Not on file    Attends meetings of clubs or organizations: Not on file    Relationship status: Not on file  . Intimate partner violence    Fear of current or ex partner: Not on file    Emotionally abused: Not on file    Physically abused: Not on file    Forced sexual activity: Not on file  Other Topics Concern  . Not on file  Social History Narrative   College ed    Receptionist at [employer]    Married    2 kids    Grew up in  [place] with siblings   Current Meds  Medication Sig  . ACZONE 7.5 % GEL APPLY A SMALL AMOUNT TO AFFECTED AREA(S) ON THE SKIN EVERY DAY IN THE MORNING  . ALPRAZolam (XANAX PO) Take by mouth daily as needed.  . Cholecalciferol (VITAMIN D3 PO) Take by mouth.  . clindamycin (CLEOCIN T) 1 % lotion   . Cyanocobalamin (VITAMIN B12 PO) Take by mouth.  . cyclobenzaprine (FLEXERIL) 5 MG tablet Take 1 tablet (5 mg total) by mouth 3 (three) times daily as needed for muscle spasms.  . fluticasone (FLONASE) 50 MCG/ACT nasal spray Place 1 spray into both nostrils daily as needed for allergies or rhinitis.  Marland Kitchen levocetirizine (XYZAL) 5 MG tablet Take 5 mg by mouth every evening.  . montelukast (SINGULAIR) 10 MG tablet Take 10 mg by mouth at bedtime.  . tretinoin (RETIN-A) 0.025 % cream APPLY A SMALL AMOUNT TO AFFECTED AREA(S) EVERY EVENING   Allergies  Allergen Reactions  . Apple     Or fresh fruit throat itching    Recent Results (from the past 2160 hour(s))  Basic metabolic panel     Status: Abnormal   Collection Time: 08/06/19  1:20 PM  Result Value Ref Range   Sodium 137 135 - 145 mmol/L   Potassium 3.8 3.5 - 5.1 mmol/L   Chloride 104 98 - 111 mmol/L   CO2 25 22 - 32 mmol/L   Glucose, Bld 103 (H) 70 - 99 mg/dL   BUN 14 6 - 20 mg/dL   Creatinine, Ser 0.56 0.44 - 1.00 mg/dL   Calcium 9.1 8.9 - 10.3 mg/dL   GFR calc non Af Amer >60 >60 mL/min   GFR calc Af Amer >60 >60 mL/min   Anion gap 8 5 - 15    Comment: Performed at [facility], [address] [place], [place] [address]  CBC     Status: None   Collection Time: 08/06/19  1:20 PM  Result Value Ref Range   WBC 6.6 4.0 - 10.5 K/uL   RBC 4.44 3.87 - 5.11 MIL/uL   Hemoglobin 12.3 12.0 - 15.0 g/dL   HCT 37.1 36.0 - 46.0 %   MCV 83.6 80.0 - 100.0 fL   MCH 27.7 26.0 - 34.0 pg   MCHC 33.2 30.0 - 36.0 g/dL   RDW 12.7 11.5 - 15.5 %   Platelets 210 150 - 400 K/uL   nRBC 0.0 0.0 -  0.2 %    Comment: Performed at [facility], [address] [place], [place] [address]  Urinalysis, Complete w Microscopic     Status: Abnormal   Collection Time: 08/06/19  1:20 PM  Result Value Ref Range   Color, Urine STRAW (A) YELLOW   APPearance CLEAR (A) CLEAR   Specific Gravity, Urine 1.006 1.005 - 1.030   pH 6.0 5.0 - 8.0   Glucose, UA NEGATIVE NEGATIVE mg/dL   Hgb urine dipstick NEGATIVE NEGATIVE   Bilirubin Urine NEGATIVE NEGATIVE   Ketones, ur NEGATIVE NEGATIVE mg/dL   Protein, ur NEGATIVE NEGATIVE mg/dL   Nitrite NEGATIVE NEGATIVE   Leukocytes,Ua NEGATIVE NEGATIVE   WBC, UA 0-5 0 - 5 WBC/hpf   Bacteria, UA FEW (A) NONE SEEN   Squamous Epithelial / LPF 0-5 0 - 5    Comment: Performed at [facility], [address] [place], [place] [address]  Pregnancy, urine POC     Status: None   Collection Time: 08/06/19  1:23 PM  Result Value Ref Range   Preg Test, Ur NEGATIVE NEGATIVE    Comment:        THE SENSITIVITY OF THIS METHODOLOGY IS >24 mIU/mL   Lipid panel     Status: Abnormal   Collection Time: 08/19/19  1:07 PM  Result Value Ref Range   Cholesterol, Total 163 100 - 199 mg/dL   Triglycerides 80 0 - 149 mg/dL   HDL 41 >39 mg/dL   VLDL Cholesterol Cal 15 5 - 40 mg/dL   LDL Chol Calc (NIH) 107 (H) 0 - 99 mg/dL   Chol/HDL Ratio 4.0 0.0 - 4.4 ratio    Comment:                                   T. Chol/HDL Ratio                                             Men  Women                               1/2 Avg.Risk  3.4    3.3                                   Avg.Risk  5.0    4.4                                2X Avg.Risk  9.6    7.1                                3X Avg.Risk 23.4   11.0   HgB A1c     Status: None   Collection Time: 08/19/19  1:07 PM  Result Value Ref Range   Hgb A1c MFr Bld 5.2 4.8 - 5.6 %    Comment:          Prediabetes: 5.7 - 6.4          Diabetes: >6.4          Glycemic  control for adults with diabetes: <7.0    Est. average glucose Bld gHb Est-mCnc 103 mg/dL  TSH      Status: None   Collection Time: 08/19/19  1:07 PM  Result Value Ref Range   TSH 1.550 0.450 - 4.500 uIU/mL  T4, free     Status: None   Collection Time: 08/19/19  1:07 PM  Result Value Ref Range   Free T4 1.21 0.82 - 1.77 ng/dL  Vitamin D (25 hydroxy)     Status: None   Collection Time: 08/19/19  1:07 PM  Result Value Ref Range   Vit D, 25-Hydroxy 39.5 30.0 - 100.0 ng/mL    Comment: Vitamin D deficiency has been defined by the [place] and an Endocrine Society practice guideline as a level of serum 25-OH vitamin D less than 20 ng/mL (1,2). The Endocrine Society went on to further define vitamin D insufficiency as a level between 21 and 29 ng/mL (2). 1. IOM (Institute of Medicine). 2010. Dietary reference    intakes for calcium and D. [place]: The    [employer]. 2. Holick MF, Binkley [place], Bischoff-Ferrari HA, et al.    Evaluation, treatment, and prevention of vitamin D    deficiency: an Endocrine Society clinical practice    guideline. JCEM. 2011 Jul; 96(7):1911-30.   B12     Status: None   Collection Time: 08/19/19  1:07 PM  Result Value Ref Range   Vitamin B-12 589 232 - 1,245 pg/mL  ANA w/Reflex if Positive     Status: None   Collection Time: 08/19/19  1:07 PM  Result Value Ref Range   Anti Nuclear Antibody (ANA) Negative Negative  Rheumatoid Factor     Status: None   Collection Time: 08/19/19  1:07 PM  Result Value Ref Range   Rhuematoid fact SerPl-aCnc <10.0 0.0 - 13.9 IU/mL  Sedimentation rate     Status: None   Collection Time: 08/19/19  1:07 PM  Result Value Ref Range   Sed Rate 27 0 - 32 mm/hr  C-reactive protein     Status: None   Collection Time: 08/19/19  1:07 PM  Result Value Ref Range   CRP 4 0 - 10 mg/L  CYCLIC CITRUL PEPTIDE ANTIBODY, IGG/IGA     Status: Abnormal   Collection Time: 08/19/19  1:07 PM  Result Value Ref Range   Cyclic Citrullin Peptide Ab 39 (H) 0 - 19 units    Comment:                           Negative                <20                           Weak positive      20 - 39                           Moderate positive  40 - 59                           Strong positive        >59    Objective  Body mass index is 29.86 kg/m. Wt Readings from Last 3 Encounters:  08/18/19 185 lb (83.9 kg)  08/06/19 182 lb (82.6 kg)  03/24/19 182 lb 14.4 oz (83 kg)   Temp Readings from Last 3 Encounters:  08/18/19 97.9 F (36.6 C) (Skin)  08/06/19 (!) 97.5 F (36.4 C) (Oral)  08/18/18 99.2 F (37.3 C) (Oral)   BP Readings from Last 3 Encounters:  08/18/19 (!) 102/58  08/06/19 99/62  03/24/19 (!) 103/59   Pulse Readings from Last 3 Encounters:  08/18/19 65  08/06/19 (!) 57  03/24/19 70    Physical Exam Vitals signs and nursing note reviewed.  Constitutional:      Appearance: Normal appearance. She is well-developed, well-groomed and overweight.     Comments: +mask on    HENT:     Head: Normocephalic and atraumatic.  Eyes:     Conjunctiva/sclera: Conjunctivae normal.     Pupils: Pupils are equal, round, and reactive to light.  Cardiovascular:     Rate and Rhythm: Normal rate and regular rhythm.     Heart sounds: Normal heart sounds. No murmur.  Pulmonary:     Effort: Pulmonary effort is normal.     Breath sounds: Normal breath sounds.  Abdominal:     General: Abdomen is flat. Bowel sounds are normal.     Tenderness: There is no abdominal tenderness.  Skin:    General: Skin is warm and dry.     Comments: Healing blisters to fingers Could be dyshydrotic eczema    Neurological:     General: No focal deficit present.     Mental Status: She is alert and oriented to person, place, and time. Mental status is at baseline.     Gait: Gait normal.  Psychiatric:        Attention and Perception: Attention and perception normal.        Mood and Affect: Mood and affect normal.        Speech: Speech normal.        Behavior: Behavior normal. Behavior is cooperative.        Thought Content:  Thought content normal.        Cognition and Memory: Cognition and memory normal.        Judgment: Judgment normal.     Assessment  Plan  Asthma, mild intermittent, well-controlled - Plan: albuterol (VENTOLIN HFA) 108 (90 Base) MCG/ACT inhaler  Overweight (BMI 25.0-29.9) - Plan: phentermine (ADIPEX-P) 37.5 MG tablet taking 1/2 pill qam tolerating f/u in 2 months   Polyarthralgia with elevated CCP 39 08/19/2019 normal range 0-19 will sent referral Dr. Amil Amen or Dr. Estanislado Pandy - Plan: Antinuclear Antib (ANA), Cyclic citrul peptide antibody, IgG, ANA w/Reflex if Positive, Rheumatoid Factor, Sedimentation rate, C-reactive protein, CYCLIC CITRUL PEPTIDE ANTIBODY, IGG/IGA, Ambulatory referral to Rheumatology,   B12 deficiency - Plan: B12 230 09/25/18 check b12 again  Vitamin D deficiency - Plan: Vitamin D (25 hydroxy)  Hand edema - Plan: Antinuclear Antib (ANA), Cyclic citrul peptide antibody, IgG, ANA w/Reflex if Positive, Rheumatoid Factor, Sedimentation rate, C-reactive protein, CYCLIC CITRUL PEPTIDE ANTIBODY, IGG/IGA, Ambulatory referral to Rheumatology Reduce salt intake   Leg edema - Plan: Antinuclear Antib (ANA), Cyclic citrul peptide antibody, IgG, ANA w/Reflex if Positive, Rheumatoid Factor, Sedimentation rate, C-reactive protein, CYCLIC CITRUL PEPTIDE ANTIBODY, IGG/IGA, Ambulatory referral to Rheumatology Reduce salt intake   Hyperglycemia - Plan: HgB A1c  HLD- check lipid panel rec healthy diet and exercise   Confusion - Plan: Antinuclear Antib (ANA), Cyclic citrul peptide antibody, IgG, ANA w/Reflex if Positive, Rheumatoid Factor, Sedimentation rate, C-reactive protein, CYCLIC CITRUL PEPTIDE ANTIBODY, IGG/IGA,  -will have pt f/u with  neurology Dr. Manuella Ghazi  -reviewed CT 08/06/19 head and MRI 09/2018  F/u neurology Dr. Manuella Ghazi   Cyclic citrullinated peptide (CCP) antibody positive - Plan: Ambulatory referral to Rheumatology  Saw 08/27/19 Dr. Amil Amen b/l hand Xray f/u in 4 months  Xray  negative b/l   hM Flu shot had in late 06/2019  Tdap in 2015 [facility] pt [facility]  LMP 07/20/2019  Consider HPV vaccine pap 03/24/2019 neg neg HPV h/o colp and LEEP   Eye exam last 2019 [facility] Dr. Glennon Mac  Cards [facility] [place] [facility]  Dentist Dr. Phillip Heal  Neurology Dr. Manuella Ghazi  Allergy Dr. Donneta Romberg  Dermatology Dr. Laurence Ferrari appt sch 09/2019   Provider: Dr. Olivia Mackie Mc[facility]

## 2019-08-19 ENCOUNTER — Telehealth: Payer: Self-pay

## 2019-08-19 ENCOUNTER — Encounter: Payer: Self-pay | Admitting: Internal Medicine

## 2019-08-19 DIAGNOSIS — R410 Disorientation, unspecified: Secondary | ICD-10-CM | POA: Diagnosis not present

## 2019-08-19 DIAGNOSIS — Z1329 Encounter for screening for other suspected endocrine disorder: Secondary | ICD-10-CM | POA: Diagnosis not present

## 2019-08-19 DIAGNOSIS — Z13220 Encounter for screening for lipoid disorders: Secondary | ICD-10-CM | POA: Diagnosis not present

## 2019-08-19 DIAGNOSIS — R600 Localized edema: Secondary | ICD-10-CM | POA: Diagnosis not present

## 2019-08-19 DIAGNOSIS — E559 Vitamin D deficiency, unspecified: Secondary | ICD-10-CM | POA: Diagnosis not present

## 2019-08-19 DIAGNOSIS — M2550 Pain in unspecified joint: Secondary | ICD-10-CM | POA: Diagnosis not present

## 2019-08-19 DIAGNOSIS — E538 Deficiency of other specified B group vitamins: Secondary | ICD-10-CM | POA: Diagnosis not present

## 2019-08-19 DIAGNOSIS — R739 Hyperglycemia, unspecified: Secondary | ICD-10-CM | POA: Diagnosis not present

## 2019-08-19 NOTE — Telephone Encounter (Signed)
Patient is wanting labs faxed to her . Please advise    Fax number [phone] Attention: Tati

## 2019-08-19 NOTE — Telephone Encounter (Signed)
Spoken to patient, she stated to disreguard this message.

## 2019-08-19 NOTE — Telephone Encounter (Signed)
Copied from [place] [id number]. Topic: General - Other >> Aug 19, 2019  9:58 AM Sheran Luz wrote: Patient calling about mychart message sent today. She is requesting call back.

## 2019-08-21 LAB — VITAMIN B12: Vitamin B-12: 589 pg/mL (ref 232–1245)

## 2019-08-21 LAB — LIPID PANEL
Chol/HDL Ratio: 4 ratio (ref 0.0–4.4)
Cholesterol, Total: 163 mg/dL (ref 100–199)
HDL: 41 mg/dL (ref >39)
LDL Chol Calc (NIH): 107 mg/dL — ABNORMAL HIGH (ref 0–99)
Triglycerides: 80 mg/dL (ref 0–149)
VLDL Cholesterol Cal: 15 mg/dL (ref 5–40)

## 2019-08-21 LAB — TSH: TSH: 1.55 u[IU]/mL (ref 0.450–4.500)

## 2019-08-21 LAB — ANA W/REFLEX IF POSITIVE: Anti Nuclear Antibody (ANA): NEGATIVE

## 2019-08-21 LAB — HEMOGLOBIN A1C
Est. average glucose Bld gHb Est-mCnc: 103 mg/dL
Hgb A1c MFr Bld: 5.2 % (ref 4.8–5.6)

## 2019-08-21 LAB — RHEUMATOID FACTOR: Rheumatoid fact SerPl-aCnc: <10 IU/mL (ref 0.0–13.9)

## 2019-08-21 LAB — SEDIMENTATION RATE: Sed Rate: 27 mm/hr (ref 0–32)

## 2019-08-21 LAB — T4, FREE: Free T4: 1.21 ng/dL (ref 0.82–1.77)

## 2019-08-21 LAB — VITAMIN D 25 HYDROXY (VIT D DEFICIENCY, FRACTURES): Vit D, 25-Hydroxy: 39.5 ng/mL (ref 30.0–100.0)

## 2019-08-21 LAB — C-REACTIVE PROTEIN: CRP: 4 mg/L (ref 0–10)

## 2019-08-21 LAB — CYCLIC CITRUL PEPTIDE ANTIBODY, IGG/IGA: Cyclic Citrullin Peptide Ab: 39 units — ABNORMAL HIGH (ref 0–19)

## 2019-08-23 ENCOUNTER — Telehealth: Payer: Self-pay | Admitting: *Deleted

## 2019-08-23 ENCOUNTER — Encounter: Payer: Self-pay | Admitting: Internal Medicine

## 2019-08-23 NOTE — Telephone Encounter (Signed)
Copied from [place] [phone]. Topic: General - Other >> Aug 23, 2019 12:42 PM Carolyn Stare wrote: Pt has questions about her labs she saw on mychart would like a call back

## 2019-08-24 ENCOUNTER — Other Ambulatory Visit: Payer: Self-pay | Admitting: Internal Medicine

## 2019-08-24 ENCOUNTER — Encounter: Payer: Self-pay | Admitting: Internal Medicine

## 2019-08-24 DIAGNOSIS — M2550 Pain in unspecified joint: Secondary | ICD-10-CM | POA: Insufficient documentation

## 2019-08-24 DIAGNOSIS — R410 Disorientation, unspecified: Secondary | ICD-10-CM | POA: Insufficient documentation

## 2019-08-24 DIAGNOSIS — E663 Overweight: Secondary | ICD-10-CM | POA: Insufficient documentation

## 2019-08-24 DIAGNOSIS — R7989 Other specified abnormal findings of blood chemistry: Secondary | ICD-10-CM | POA: Insufficient documentation

## 2019-08-24 DIAGNOSIS — R768 Other specified abnormal immunological findings in serum: Secondary | ICD-10-CM | POA: Insufficient documentation

## 2019-08-24 DIAGNOSIS — E538 Deficiency of other specified B group vitamins: Secondary | ICD-10-CM | POA: Insufficient documentation

## 2019-08-24 HISTORY — DX: Disorientation, unspecified: R41.0

## 2019-08-24 NOTE — Progress Notes (Signed)
Note has been faxed electronically 

## 2019-08-27 DIAGNOSIS — E663 Overweight: Secondary | ICD-10-CM | POA: Diagnosis not present

## 2019-08-27 DIAGNOSIS — R21 Rash and other nonspecific skin eruption: Secondary | ICD-10-CM | POA: Diagnosis not present

## 2019-08-27 DIAGNOSIS — R768 Other specified abnormal immunological findings in serum: Secondary | ICD-10-CM | POA: Diagnosis not present

## 2019-08-27 DIAGNOSIS — M2550 Pain in unspecified joint: Secondary | ICD-10-CM | POA: Diagnosis not present

## 2019-08-27 DIAGNOSIS — Z6829 Body mass index (BMI) 29.0-29.9, adult: Secondary | ICD-10-CM | POA: Diagnosis not present

## 2019-10-04 DIAGNOSIS — R21 Rash and other nonspecific skin eruption: Secondary | ICD-10-CM | POA: Diagnosis not present

## 2019-10-04 DIAGNOSIS — L700 Acne vulgaris: Secondary | ICD-10-CM | POA: Diagnosis not present

## 2019-10-05 ENCOUNTER — Other Ambulatory Visit: Payer: Self-pay | Admitting: Internal Medicine

## 2019-10-05 ENCOUNTER — Encounter: Payer: Self-pay | Admitting: Internal Medicine

## 2019-10-05 DIAGNOSIS — J309 Allergic rhinitis, unspecified: Secondary | ICD-10-CM

## 2019-10-05 MED ORDER — MONTELUKAST SODIUM 10 MG PO TABS: 10.0000 mg | ORAL_TABLET | Freq: Every day | ORAL | 3 refills | Status: DC

## 2019-10-05 MED ORDER — LEVOCETIRIZINE DIHYDROCHLORIDE 5 MG PO TABS: 5.0000 mg | ORAL_TABLET | Freq: Every evening | ORAL | 3 refills | Status: DC

## 2019-10-17 DIAGNOSIS — Z03818 Encounter for observation for suspected exposure to other biological agents ruled out: Secondary | ICD-10-CM | POA: Diagnosis not present

## 2019-10-17 DIAGNOSIS — J069 Acute upper respiratory infection, unspecified: Secondary | ICD-10-CM | POA: Diagnosis not present

## 2019-10-28 ENCOUNTER — Ambulatory Visit: Payer: 59 | Admitting: Internal Medicine

## 2019-10-29 ENCOUNTER — Other Ambulatory Visit: Payer: Self-pay

## 2019-10-29 ENCOUNTER — Ambulatory Visit (INDEPENDENT_AMBULATORY_CARE_PROVIDER_SITE_OTHER): Payer: 59 | Admitting: Internal Medicine

## 2019-10-29 ENCOUNTER — Encounter: Payer: Self-pay | Admitting: Internal Medicine

## 2019-10-29 VITALS — Ht 66.0 in | Wt 171.0 lb

## 2019-10-29 DIAGNOSIS — E785 Hyperlipidemia, unspecified: Secondary | ICD-10-CM | POA: Diagnosis not present

## 2019-10-29 DIAGNOSIS — M25532 Pain in left wrist: Secondary | ICD-10-CM | POA: Diagnosis not present

## 2019-10-29 DIAGNOSIS — E663 Overweight: Secondary | ICD-10-CM

## 2019-10-29 DIAGNOSIS — R202 Paresthesia of skin: Secondary | ICD-10-CM | POA: Diagnosis not present

## 2019-10-29 DIAGNOSIS — M25531 Pain in right wrist: Secondary | ICD-10-CM | POA: Diagnosis not present

## 2019-10-29 DIAGNOSIS — S80862D Insect bite (nonvenomous), left lower leg, subsequent encounter: Secondary | ICD-10-CM

## 2019-10-29 DIAGNOSIS — R200 Anesthesia of skin: Secondary | ICD-10-CM

## 2019-10-29 DIAGNOSIS — L700 Acne vulgaris: Secondary | ICD-10-CM | POA: Diagnosis not present

## 2019-10-29 DIAGNOSIS — Z0000 Encounter for general adult medical examination without abnormal findings: Secondary | ICD-10-CM | POA: Diagnosis not present

## 2019-10-29 DIAGNOSIS — W57XXXD Bitten or stung by nonvenomous insect and other nonvenomous arthropods, subsequent encounter: Secondary | ICD-10-CM

## 2019-10-29 MED ORDER — CLINDAMYCIN PHOSPHATE 1 % EX GEL: Freq: Two times a day (BID) | CUTANEOUS | 11 refills | Status: DC

## 2019-10-29 MED ORDER — TRETINOIN 0.025 % EX CREA: TOPICAL_CREAM | Freq: Every day | CUTANEOUS | 11 refills | Status: DC

## 2019-10-29 NOTE — Progress Notes (Addendum)
Virtual Visit via Video Note  I connected with Carolyn Robles  on 10/29/19 at  2:47 PM EST by a video enabled telemedicine application and verified that I am speaking with the correct person using two identifiers.  Location patient: home Location provider:work or home office Persons participating in the virtual visit: patient, provider  I discussed the limitations of evaluation and management by telemedicine and the availability of in person appointments. The patient expressed understanding and agreed to proceed.   HPI: 1. B/l wrist pain and numbness in fingers Xray rheumatology Dr. Amil Amen negative and hand swelling better but when she tries to wear hand braces hand swelling worse and hands turn purple  2. No further episodes of h/a or confusion  3. Overweight lost 10 lbs on adipex 1/2 dose 37.5 could not tolerate higher dose  4. Acne to chin wants refill of tretinoin to use to face and will try clindamycin again   ROS: See pertinent positives and negatives per HPI.  Past Medical History:  Diagnosis Date  . Allergy    pollen, trees, etc   . Asthma    seasonal  . UTI (urinary tract infection)     Past Surgical History:  Procedure Laterality Date  . HYSTEROSCOPY N/A 05/04/2018   Procedure: HYSTEROSCOPY;  Surgeon: Defrancesco, Alanda Slim, MD;  Location: [facility] ORS;  Service: Gynecology;  Laterality: N/A;  . IUD REMOVAL N/A 05/04/2018   Procedure: INTRAUTERINE DEVICE (IUD) REMOVAL;  Surgeon: Brayton Mars, MD;  Location: [facility] ORS;  Service: Gynecology;  Laterality: N/A;  . LEEP     for h/o abnormal pap  . RETAINED PLACENTA REMOVAL     with vaccum suction     Family History  Problem Relation Age of Onset  . Heart disease Mother        MI died when pt was young  . Diabetes Father   . Depression Sister   . Miscarriages / Stillbirths Sister   . Alcohol abuse Brother   . Depression Brother   . Drug abuse Brother     SOCIAL HX:  Married with kids   Current  Outpatient Medications:  .  ACZONE 7.5 % GEL, APPLY A SMALL AMOUNT TO AFFECTED AREA(S) ON THE SKIN EVERY DAY IN THE MORNING, Disp: , Rfl:  .  albuterol (VENTOLIN HFA) 108 (90 Base) MCG/ACT inhaler, Inhale 1-2 puffs into the lungs every 6 (six) hours as needed for wheezing or shortness of breath., Disp: 18 g, Rfl: 12 .  ALPRAZolam (XANAX PO), Take by mouth daily as needed., Disp: , Rfl:  .  Cholecalciferol (VITAMIN D3 PO), Take by mouth., Disp: , Rfl:  .  Cyanocobalamin (VITAMIN B12 PO), Take by mouth., Disp: , Rfl:  .  cyclobenzaprine (FLEXERIL) 5 MG tablet, Take 1 tablet (5 mg total) by mouth 3 (three) times daily as needed for muscle spasms., Disp: 30 tablet, Rfl: 2 .  fluticasone (FLONASE) 50 MCG/ACT nasal spray, Place 1 spray into both nostrils daily as needed for allergies or rhinitis., Disp: , Rfl:  .  levocetirizine (XYZAL) 5 MG tablet, Take 1 tablet (5 mg total) by mouth every evening., Disp: 90 tablet, Rfl: 3 .  montelukast (SINGULAIR) 10 MG tablet, Take 1 tablet (10 mg total) by mouth at bedtime., Disp: 90 tablet, Rfl: 3 .  phentermine (ADIPEX-P) 37.5 MG tablet, Take 0.5-1 tablets (18.75-37.5 mg total) by mouth daily before breakfast., Disp: 60 tablet, Rfl: 0 .  tretinoin (RETIN-A) 0.025 % cream, Apply topically at bedtime. With moisturizer,  Disp: 45 g, Rfl: 11 .  clindamycin (CLINDAGEL) 1 % gel, Apply topically 2 (two) times daily., Disp: 30 g, Rfl: 11  EXAM:  VITALS per patient if applicable:  GENERAL: alert, oriented, appears well and in no acute distress  HEENT: atraumatic, conjunttiva clear, no obvious abnormalities on inspection of external nose and ears  NECK: normal movements of the head and neck  LUNGS: on inspection no signs of respiratory distress, breathing rate appears normal, no obvious gross SOB, gasping or wheezing  CV: no obvious cyanosis  MS: moves all visible extremities without noticeable abnormality  PSYCH/NEURO: pleasant and cooperative, no obvious  depression or anxiety, speech and thought processing grossly intact  ASSESSMENT AND PLAN:  Discussed the following assessment and plan:  Acne vulgaris - Plan: tretinoin (RETIN-A) 0.025 % cream, clindamycin (CLINDAGEL) 1 % gel F/u derm as well  Could not tolerate doxycycline  Pain in both wrists with n/t and with elevated CCP Consider EMG/NCS in the future with Dr. Manuella Ghazi  F/u rheumatology Dr. Amil Amen 01/2020  rec voltaren gel otc and tumeric supplements   Xrays 08/27/19 neg b/l hands   Overweight (BMI 25.0-29.9) Off adipex 1/2 pill for now will take a break  congrats on losing 10 lbs   hM Fasting labs 03/2020 or after  Flu shot had in late 06/2019  Tdap in 2015/6 per pt [facility] LMP 07/20/2019  Consider HPV vaccine pap 03/24/2019 neg neg HPV h/o colp and LEEP   Eye exam last 2019 [facility] Dr. Glennon Mac  [facility] [place] [facility]  Dentist Dr. Phillip Heal  Neurology Dr. Manuella Ghazi  Allergy Dr. Donneta Romberg  Dermatology Dr. Laurence Ferrari appt sch 09/2019  -we discussed possible serious and likely etiologies, options for evaluation and workup, limitations of telemedicine visit vs in person visit, treatment, treatment risks and precautions. Pt prefers to treat via telemedicine empirically rather then risking or undertaking an in person visit at this moment. Patient agrees to seek prompt in person care if worsening, new symptoms arise, or if is not improving with treatment.   I discussed the assessment and treatment plan with the patient. The patient was provided an opportunity to ask questions and all were answered. The patient agreed with the plan and demonstrated an understanding of the instructions.   The patient was advised to call back or seek an in-person evaluation if the symptoms worsen or if the condition fails to improve as anticipated.  Time spent 20 minutes  Delorise Jackson, MD

## 2019-11-05 DIAGNOSIS — J019 Acute sinusitis, unspecified: Secondary | ICD-10-CM | POA: Diagnosis not present

## 2019-11-05 DIAGNOSIS — Z20822 Contact with and (suspected) exposure to covid-19: Secondary | ICD-10-CM | POA: Diagnosis not present

## 2019-11-05 DIAGNOSIS — J9801 Acute bronchospasm: Secondary | ICD-10-CM | POA: Diagnosis not present

## 2019-11-05 DIAGNOSIS — Z03818 Encounter for observation for suspected exposure to other biological agents ruled out: Secondary | ICD-10-CM | POA: Diagnosis not present

## 2019-11-11 ENCOUNTER — Telehealth: Payer: Self-pay | Admitting: Internal Medicine

## 2019-11-11 DIAGNOSIS — L040 Acute lymphadenitis of face, head and neck: Secondary | ICD-10-CM | POA: Diagnosis not present

## 2019-11-11 DIAGNOSIS — H9202 Otalgia, left ear: Secondary | ICD-10-CM | POA: Diagnosis not present

## 2019-11-11 DIAGNOSIS — H6982 Other specified disorders of Eustachian tube, left ear: Secondary | ICD-10-CM | POA: Diagnosis not present

## 2019-11-11 DIAGNOSIS — H6062 Unspecified chronic otitis externa, left ear: Secondary | ICD-10-CM | POA: Diagnosis not present

## 2019-11-11 NOTE — Telephone Encounter (Signed)
Pt went to [facility] urgent care last Friday and they prescribed prednisone and antibiotics. They told her she had a sinus and ear infection. Pt said she tested negative for Covid but her left ear is still hurting. Is there anything Dr.Tracy can call in since she doesn't have any available appointments?

## 2019-11-11 NOTE — Telephone Encounter (Signed)
Patient stated she went to ENT. ENT stated it maybe coming from her joint pain. No infection.

## 2019-11-11 NOTE — Telephone Encounter (Signed)
If agreeable to my chart encounter for consult  Can Rx ear drops ?  Is she agreeable if so send my chart with whats going on with ear    [place]

## 2019-11-11 NOTE — Telephone Encounter (Signed)
She was treated by ENT?

## 2019-11-11 NOTE — Telephone Encounter (Signed)
Was seen tx'ed by Ent?

## 2019-11-11 NOTE — Telephone Encounter (Signed)
If no infection could be TMJ  Did see get treated by ENT for her ear symptoms?  Is she ok and do I need to do anything else?   [place]

## 2019-11-17 ENCOUNTER — Ambulatory Visit (INDEPENDENT_AMBULATORY_CARE_PROVIDER_SITE_OTHER): Payer: 59 | Admitting: Obstetrics and Gynecology

## 2019-11-17 ENCOUNTER — Telehealth: Payer: Self-pay | Admitting: Internal Medicine

## 2019-11-17 ENCOUNTER — Telehealth: Payer: Self-pay | Admitting: Obstetrics and Gynecology

## 2019-11-17 ENCOUNTER — Other Ambulatory Visit: Payer: Self-pay

## 2019-11-17 ENCOUNTER — Encounter: Payer: Self-pay | Admitting: Obstetrics and Gynecology

## 2019-11-17 VITALS — BP 108/72 | HR 84 | Ht 66.0 in | Wt 178.0 lb

## 2019-11-17 DIAGNOSIS — R102 Pelvic and perineal pain: Secondary | ICD-10-CM | POA: Diagnosis not present

## 2019-11-17 DIAGNOSIS — N762 Acute vulvitis: Secondary | ICD-10-CM

## 2019-11-17 MED ORDER — FLUCONAZOLE 150 MG PO TABS: 150.0000 mg | ORAL_TABLET | Freq: Once | ORAL | 0 refills | Status: AC

## 2019-11-17 NOTE — Telephone Encounter (Signed)
Pt called in and said she is having pelvic pain and a lot of pressure.  Pt said that she was given an antibiotic for a yeast infection at [facility] but she is in pain. She wants to be seen by her provider only.  Please advise.

## 2019-11-17 NOTE — Telephone Encounter (Signed)
She saw ob/gyn today I believe so this is taken care of confirm with pt but this is ob/gyn issue   [place]

## 2019-11-17 NOTE — Telephone Encounter (Signed)
Pt added to the schedule this evening.

## 2019-11-17 NOTE — Progress Notes (Signed)
    GYNECOLOGY PROGRESS NOTE  Subjective:    Patient ID: Carolyn Robles, female    DOB: 1986/06/20, 34 y.o.   MRN: [id number]  HPI  Patient is a 34 y.o. [id number] female who presents for complaints of pelvic pressure several days ago and noted redness and swelling of vulva starting last night.  Notes that she was recently treated for an ear infection with antibiotics and steroids by urgent care (~ 2 weeks ago), was given a prescription for prophylaxis for yeast infection, states that she took dose last night.  Was concerned that something else may be going on and desired to be checked out.   The following portions of the patient's history were reviewed and updated as appropriate: allergies, current medications, past family history, past medical history, past social history, past surgical history and problem list.  Review of Systems Pertinent items noted in HPI and remainder of comprehensive ROS otherwise negative.   Objective:   Blood pressure 108/72, pulse 84, height 5\' 6"  (1.676 m), weight 178 lb (80.7 kg), last menstrual period 10/26/2019. General appearance: alert and no distress Abdomen: soft, non-tender; bowel sounds normal; no masses,  no organomegaly Pelvic: external genitalia normal, rectovaginal septum normal.  Vagina with scant thin discharge, no odor.  Cervix normal appearing, no lesions and no motion tenderness.  Uterus mobile, nontender, normal shape and size.  Adnexae non-palpable, nontender bilaterally.    Assessment:   Vulvitis (resolving)  Plan:   - Patient s/p recent antibiotic treatment, possible developing yeast vulvitis, however symptoms have improved since taking Diflucan.  Given reassurance. Will also give refill in case symptoms not completely resolved within 3 days.    [name], MD [facility]

## 2019-11-17 NOTE — Telephone Encounter (Signed)
There is a 9am opening tomorrow, is it okay to use it?

## 2019-11-17 NOTE — Progress Notes (Signed)
Pt present due to having pelvic pain and vaginal swollen on in the inside. Pt stated that the pain and swelling is in the inside of the vaginal. Pt stated no odor, but abd pain and bloating.

## 2019-11-17 NOTE — Telephone Encounter (Signed)
Pt called in and stated that she is having pelvic pressure and is swollen. The pt is trying to be seen and stated that she could see anyone  but I informed her that [facility] is her doctor and they  dont go back and forward. The pt is requesting a appointment today or tomorrow. Please advise

## 2019-11-18 ENCOUNTER — Encounter: Payer: 59 | Admitting: Obstetrics and Gynecology

## 2019-11-18 DIAGNOSIS — R768 Other specified abnormal immunological findings in serum: Secondary | ICD-10-CM | POA: Diagnosis not present

## 2019-11-18 DIAGNOSIS — M2550 Pain in unspecified joint: Secondary | ICD-10-CM | POA: Diagnosis not present

## 2019-11-18 NOTE — Telephone Encounter (Signed)
I called patient & she confirmed that she was doing well. She did see [facility] yesterday to address pelvic pain. She will let us know if anything is needed from [place].

## 2019-12-03 ENCOUNTER — Other Ambulatory Visit: Payer: Self-pay | Admitting: Internal Medicine

## 2019-12-03 DIAGNOSIS — E663 Overweight: Secondary | ICD-10-CM

## 2019-12-03 MED ORDER — PHENTERMINE HCL 37.5 MG PO TABS: 18.2500 mg | ORAL_TABLET | Freq: Every day | ORAL | 0 refills | Status: DC

## 2019-12-03 NOTE — Telephone Encounter (Signed)
Pt needs a refill on phentermine (ADIPEX-P) 37.5 MG tablet sent to [facility]

## 2019-12-03 NOTE — Telephone Encounter (Signed)
Pended for your approval or denial.  ° °

## 2019-12-03 NOTE — Addendum Note (Signed)
Addended by: Thressa Sheller on: 12/03/2019 01:45 PM   Modules accepted: Orders

## 2019-12-16 ENCOUNTER — Other Ambulatory Visit: Payer: Self-pay

## 2019-12-16 ENCOUNTER — Encounter: Payer: Self-pay | Admitting: Obstetrics and Gynecology

## 2019-12-16 ENCOUNTER — Ambulatory Visit (INDEPENDENT_AMBULATORY_CARE_PROVIDER_SITE_OTHER): Payer: 59 | Admitting: Obstetrics and Gynecology

## 2019-12-16 VITALS — BP 104/70 | HR 87 | Ht 66.0 in | Wt 175.0 lb

## 2019-12-16 DIAGNOSIS — Z8742 Personal history of other diseases of the female genital tract: Secondary | ICD-10-CM

## 2019-12-16 NOTE — Progress Notes (Signed)
Pt present for colpo. Pt stated that she is doing well no problems.

## 2019-12-16 NOTE — Progress Notes (Signed)
    GYNECOLOGY PROGRESS NOTE  Subjective:    Patient ID: Carolyn Robles, female    DOB: 1986/07/24, 34 y.o.   MRN: [id number]  HPI  Patient is a 34 y.o. [id number] female who presents for follow up of vaginitis. Notes she is no longer having any irritation or discharge. Also thought she was supposed to have a follow up colposcopy.    The following portions of the patient's history were reviewed and updated as appropriate: allergies, current medications, past family history, past medical history, past social history, past surgical history and problem list.  Review of Systems Pertinent items noted in HPI and remainder of comprehensive ROS otherwise negative.   Objective:   Blood pressure 104/70, pulse 87, height 5\' 6"  (1.676 m), weight 175 lb (79.4 kg), last menstrual period 11/19/2019. General appearance: alert and no distress Abdomen: soft, non-tender; bowel sounds normal; no masses,  no organomegaly Pelvic: external genitalia normal, rectovaginal septum normal.  Vagina without discharge.  Cervix normal appearing, no lesions and no motion tenderness.  Uterus mobile, nontender, normal shape and size.  Adnexae non-palpable, nontender bilaterally.    Assessment/Plan:  1. Recurrent vaginitis - Given reassurance regarding vaginitis symptoms 2. History of abnormal pap smear - Reviewed chart, patient with 2 normal pap smears, has had a history of abnormal pap smears in the past. Discussed that she no longer needs a f/u colposcopy.    Carolyn Maid, MD [facility]

## 2020-02-10 IMAGING — CT CT HEAD W/O CM
3 series · 16 of 47 positions shown, 19 images · non-contrast
Comparison: None.

CLINICAL DATA: Altered mental status, confusion. Possible reaction
to medicine.

EXAM:
CT HEAD WITHOUT CONTRAST
TECHNIQUE: Contiguous axial images were obtained from the base of the skull
through the vertex without intravenous contrast.

[Series 3: head wo · axial · 0.43mm/px · z∈[-142,-17]mm · 10 of 30 slices shown, 13 images]
[im 3/30  brain]
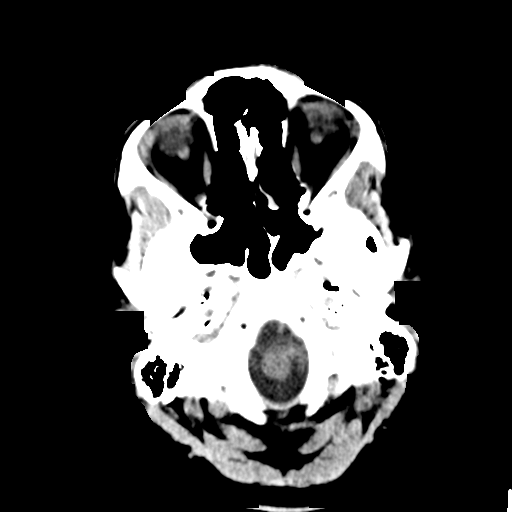
[im 3/30  bone]
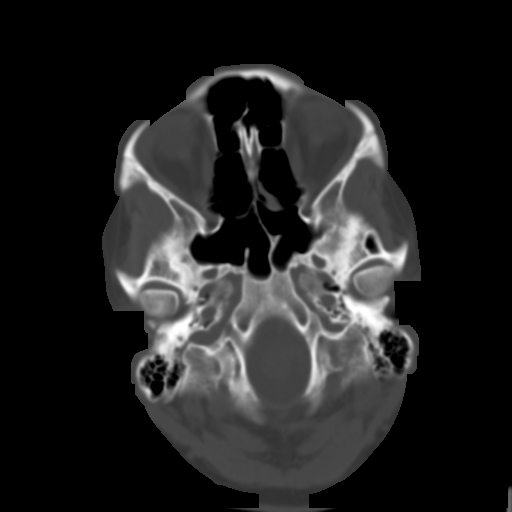
[im 6/30  brain]
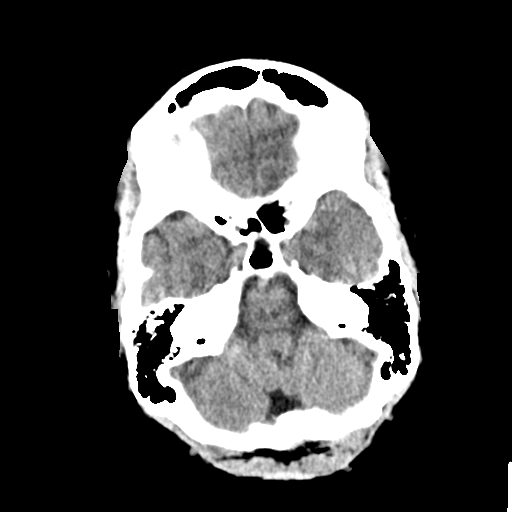
[im 9/30  brain]
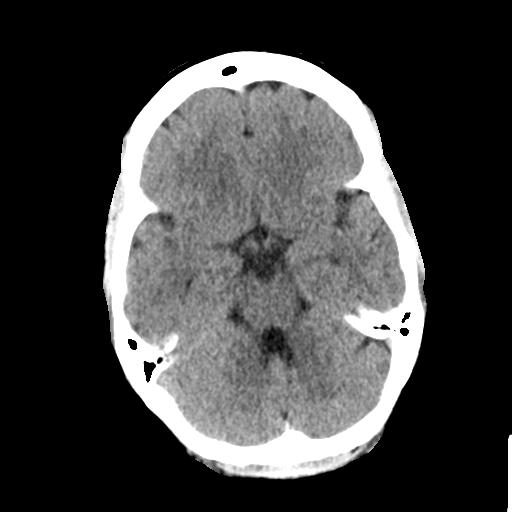
[im 11/30  brain]
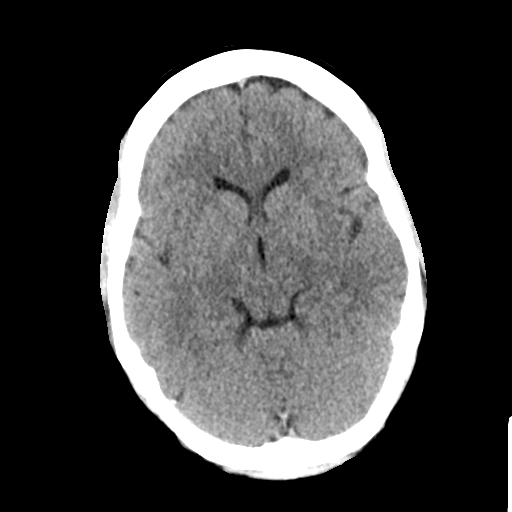
[im 14/30  brain]
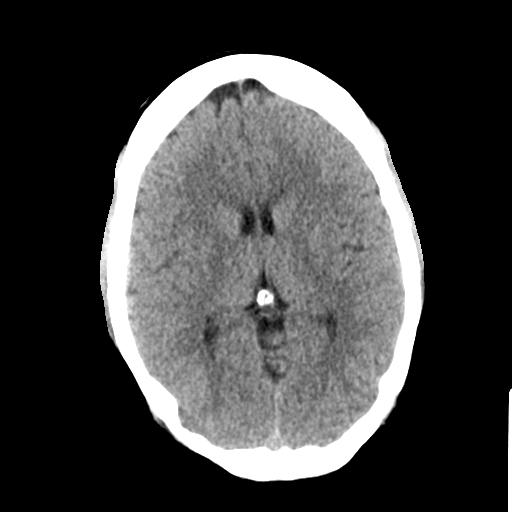
[im 14/30  bone]
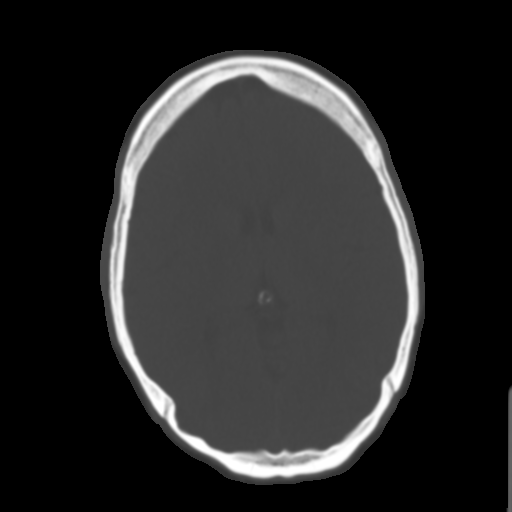
[im 17/30  brain]
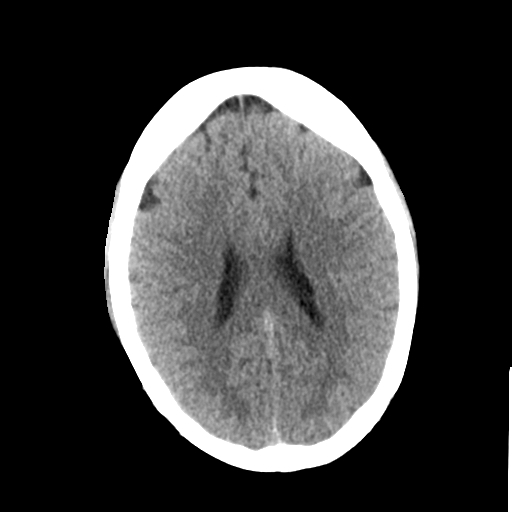
[im 20/30  brain]
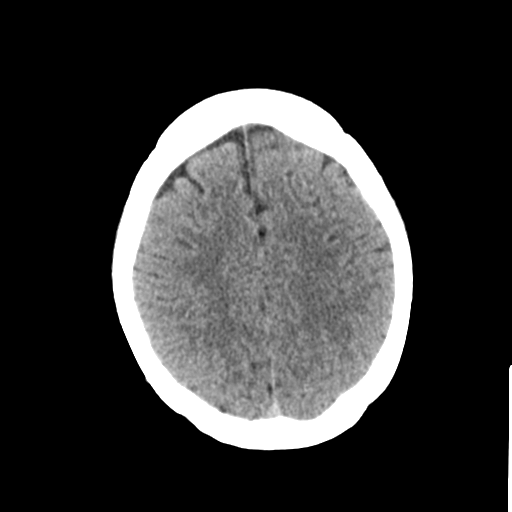
[im 23/30  brain]
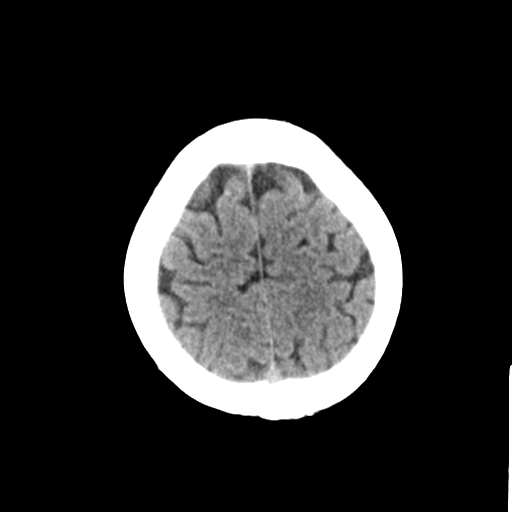
[im 25/30  brain]
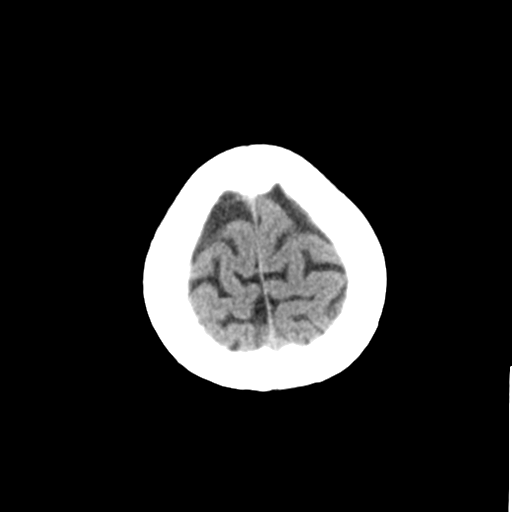
[im 25/30  bone]
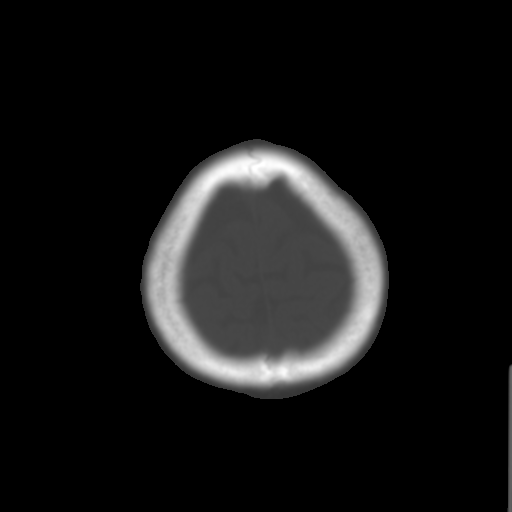
[im 28/30  brain]
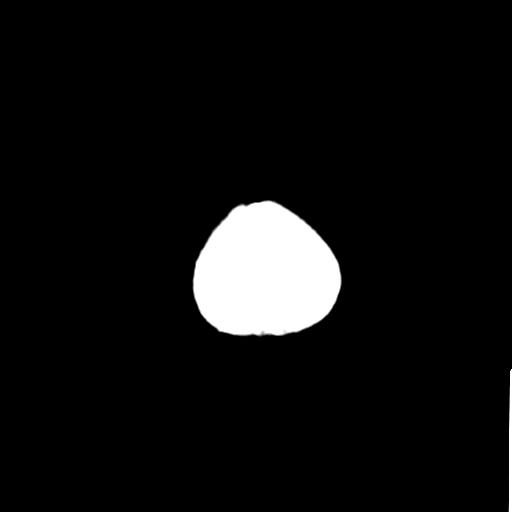

[Series 4: coronal soft tissue · coronal · 0.33mm/px · 3 of 67 slices shown]
[im 23/67  brain]
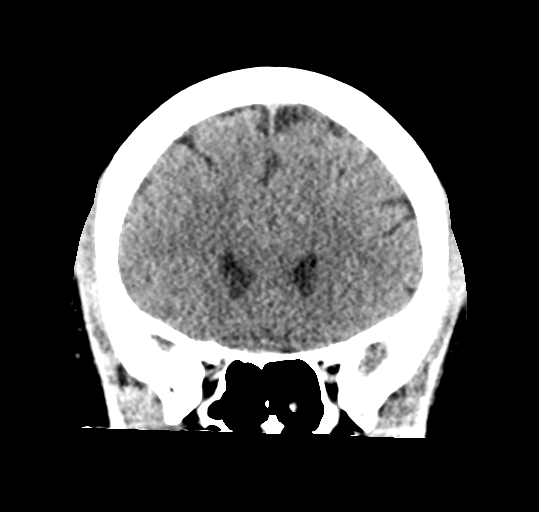
[im 30/67  brain]
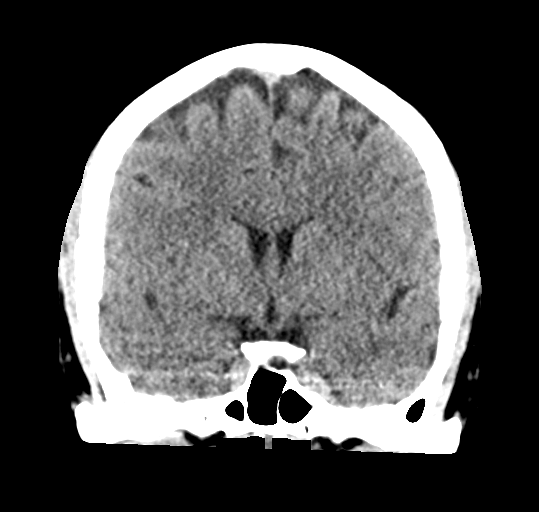
[im 37/67  brain]
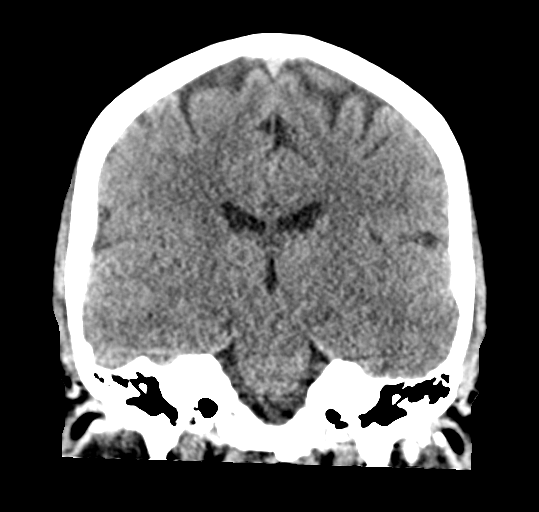

[Series 5: sagittal soft tissue · sagittal · 0.32mm/px · 3 of 53 slices shown]
[im 18/53  brain]
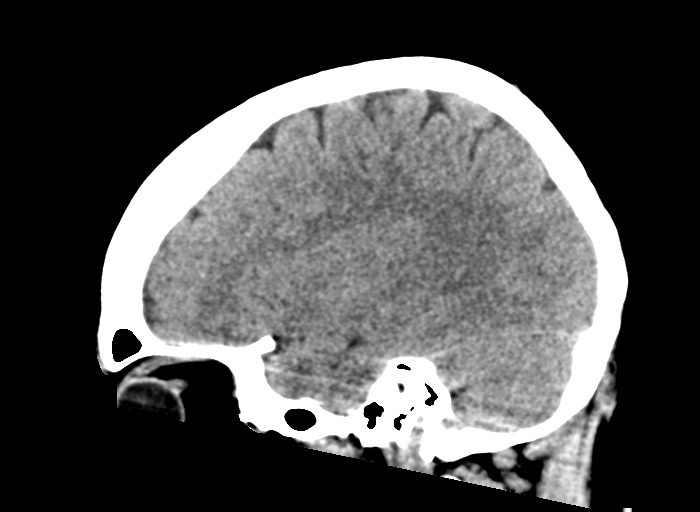
[im 27/53  brain]
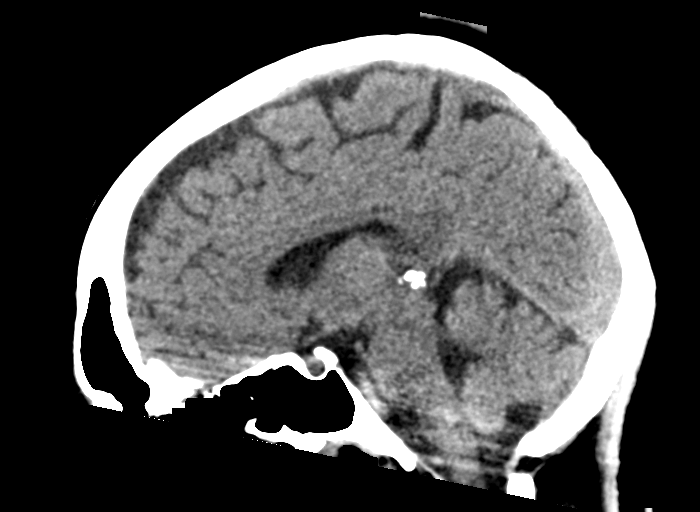
[im 35/53  brain]
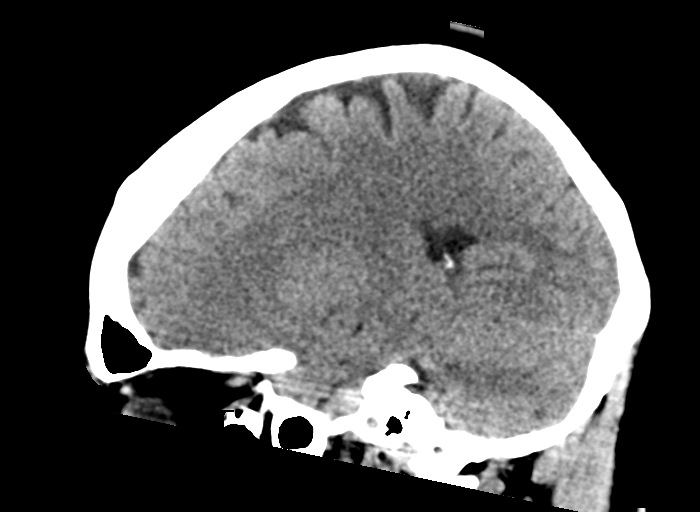

[16 of 47 positions shown; findings below may reference images not displayed]

FINDINGS: Brain: No evidence of acute infarction, hemorrhage, hydrocephalus,
extra-axial collection or mass lesion/mass effect.

Vascular: No hyperdense vessel or unexpected calcification.

Skull: Intact.  No focal lesion.

Sinuses/Orbits: Normal.

Other: None.
IMPRESSION: Normal head CT.

## 2020-02-17 DIAGNOSIS — H5213 Myopia, bilateral: Secondary | ICD-10-CM | POA: Diagnosis not present

## 2020-02-22 ENCOUNTER — Encounter: Payer: Self-pay | Admitting: Emergency Medicine

## 2020-02-22 ENCOUNTER — Other Ambulatory Visit: Payer: Self-pay

## 2020-02-22 ENCOUNTER — Ambulatory Visit
Admission: EM | Admit: 2020-02-22 | Discharge: 2020-02-22 | Disposition: A | Discharge status: Home / Self Care | Payer: 59 | Attending: Family Medicine | Admitting: Family Medicine

## 2020-02-22 DIAGNOSIS — H66001 Acute suppurative otitis media without spontaneous rupture of ear drum, right ear: Secondary | ICD-10-CM | POA: Insufficient documentation

## 2020-02-22 DIAGNOSIS — Z20822 Contact with and (suspected) exposure to covid-19: Secondary | ICD-10-CM | POA: Insufficient documentation

## 2020-02-22 DIAGNOSIS — J069 Acute upper respiratory infection, unspecified: Secondary | ICD-10-CM | POA: Diagnosis not present

## 2020-02-22 LAB — SARS CORONAVIRUS 2 (TAT 6-24 HRS): SARS Coronavirus 2: NEGATIVE

## 2020-02-22 MED ORDER — AMOXICILLIN-POT CLAVULANATE 875-125 MG PO TABS: 1.0000 | ORAL_TABLET | Freq: Two times a day (BID) | ORAL | 0 refills | Status: AC

## 2020-02-22 MED ORDER — FLUCONAZOLE 150 MG PO TABS: ORAL_TABLET | ORAL | 0 refills | Status: DC

## 2020-02-22 NOTE — Discharge Instructions (Signed)
It was very nice seeing you today in clinic. Thank you for entrusting me with your care.   Rest and Stay HYDRATED. Water and electrolyte containing beverages (Gatorade, Pedialyte) are best to prevent dehydration and electrolyte abnormalities. May use Tylenol and/or Ibuprofen as needed for pain/fever.   You were tested for SARS-CoV-2 (novel coronavirus) today. Testing is being processed at the main campus of [facility] in [place], and have been taking 12-24 hours to come back. Current recommendations from the the CDC and [place] DHHS require that you remain out of work in order to quarantine at home until negative test results are have been received. In the event that your test results are positive, you will be contacted with further directives. These measures are being implemented out of an abundance of caution to prevent transmission and spread during the current SARS-CoV-2 pandemic.  Make arrangements to follow up with your regular doctor in 1 week for re-evaluation if not improving. If your symptoms/condition worsens, please seek follow up care either here or in the ER. Please remember, our [facility] providers are "right here with you" when you need [place].   Again, it was my pleasure to take care of you today. Thank you for choosing our clinic. I hope that you start to feel better quickly.   Honor Loh, MSN, APRN, FNP-C, CEN Advanced Practice Provider [facility]

## 2020-02-22 NOTE — ED Triage Notes (Signed)
Pt c/o nasal congestion, sinus pressure, sore throat, fatigue, chills, right ear pain and cough. Started yesterday. Has not had covid vaccines.

## 2020-02-23 ENCOUNTER — Other Ambulatory Visit: Payer: Self-pay

## 2020-02-23 MED ORDER — FLUCONAZOLE 150 MG PO TABS: ORAL_TABLET | ORAL | 0 refills | Status: DC

## 2020-02-23 NOTE — ED Provider Notes (Signed)
[place], [place]   Name: Carolyn Robles DOB: [birth year] MRN: [id number] CSN: [id number] PCP: McLean-Scocuzza, Nino Glow, MD  Arrival date and time:  02/22/20 0801  Chief Complaint:  Sinus Problem and Sore Throat  NOTE: Prior to seeing the patient today, I have reviewed the triage nursing documentation and vital signs. Clinical staff has updated patient's PMH/PSHx, current medication list, and drug allergies/intolerances to ensure comprehensive history available to assist in medical decision making.   History:   HPI: Carolyn Robles is a 34 y.o. female who presents today with complaints of fatigue, cough, congestion, sore throat, generalized headaches, RIGHT otalgia, and episodes of diaphoresis that started yesterday. Cough has been non-productive with no associated shortness of breath or wheezing. She denies that she has experienced any nausea, vomiting, diarrhea, or abdominal pain. She is eating and drinking well. Patient denies any perceived alterations to her sense of taste or smell. Patient's sone recently seen here for a similar symptoms constellation; SARS-CoV-2 (novel coronavirus) testing was negative; child feeling better.She has not been tested for SARS-CoV-2 (novel coronavirus) in the past 14 days. She has not had any of the SARS-CoV-2 vaccinations at this point. Despite her symptoms, patient has not taken any over the counter interventions to help improve/relieve her reported symptoms at home.   Past Medical History:  Diagnosis Date  . Allergy    pollen, trees, etc   . Asthma    seasonal  . UTI (urinary tract infection)     Past Surgical History:  Procedure Laterality Date  . HYSTEROSCOPY N/A 05/04/2018   Procedure: HYSTEROSCOPY;  Surgeon: Defrancesco, Alanda Slim, MD;  Location: [facility] ORS;  Service: Gynecology;  Laterality: N/A;  . IUD REMOVAL N/A 05/04/2018   Procedure: INTRAUTERINE DEVICE (IUD) REMOVAL;  Surgeon: Brayton Mars, MD;  Location: [facility] ORS;  Service:  Gynecology;  Laterality: N/A;  . LEEP     for h/o abnormal pap  . RETAINED PLACENTA REMOVAL     with vaccum suction     Family History  Problem Relation Age of Onset  . Heart disease Mother        MI died when pt was young  . Diabetes Father   . Depression Sister   . Miscarriages / Stillbirths Sister   . Alcohol abuse Brother   . Depression Brother   . Drug abuse Brother     Social History   Tobacco Use  . Smoking status: Never Smoker  . Smokeless tobacco: Never Used  Substance Use Topics  . Alcohol use: Yes    Alcohol/week: 2.0 standard drinks    Types: 2 Cans of beer per week  . Drug use: No    Patient Active Problem List   Diagnosis Date Noted  . Acne vulgaris 10/29/2019  . Pain in both wrists 10/29/2019  . Overweight (BMI 25.0-29.9) 08/24/2019  . Polyarthralgia 08/24/2019  . Cyclic citrullinated peptide (CCP) antibody positive 08/24/2019  . Confusion 08/24/2019  . B12 deficiency 08/24/2019  . CIN III (cervical intraepithelial neoplasia grade III) with severe dysplasia 02/27/2018  . Asthma, well controlled 10/07/2015    Home Medications:    Current Meds  Medication Sig  . ACZONE 7.5 % GEL APPLY A SMALL AMOUNT TO AFFECTED AREA(S) ON THE SKIN EVERY DAY IN THE MORNING  . albuterol (VENTOLIN HFA) 108 (90 Base) MCG/ACT inhaler Inhale 1-2 puffs into the lungs every 6 (six) hours as needed for wheezing or shortness of breath.  . ALPRAZolam (XANAX PO) Take by mouth daily as  needed.  . Cholecalciferol (VITAMIN D3 PO) Take by mouth.  . clindamycin (CLINDAGEL) 1 % gel Apply topically 2 (two) times daily.  . Cyanocobalamin (VITAMIN B12 PO) Take by mouth.  . cyclobenzaprine (FLEXERIL) 5 MG tablet Take 1 tablet (5 mg total) by mouth 3 (three) times daily as needed for muscle spasms.  . fluticasone (FLONASE) 50 MCG/ACT nasal spray Place 1 spray into both nostrils daily as needed for allergies or rhinitis.  Marland Kitchen levocetirizine (XYZAL) 5 MG tablet Take 1 tablet (5 mg total) by  mouth every evening.  . meloxicam (MOBIC) 7.5 MG tablet Take 7.5 mg by mouth daily.  . montelukast (SINGULAIR) 10 MG tablet Take 1 tablet (10 mg total) by mouth at bedtime.    Allergies:   Apple and Doxycycline  Review of Systems (ROS):  Review of systems NEGATIVE unless otherwise noted in narrative H&P section.   Vital Signs: Today's Vitals   02/22/20 0816 02/22/20 0817 02/22/20 0821 02/22/20 0841  BP:   114/70   Pulse:   70   Resp:   18   Temp:   98.7 F (37.1 C)   TempSrc:   Oral   SpO2:   99%   Weight:  175 lb 0.7 oz (79.4 kg)    Height:  5\' 6"  (1.676 m)    PainSc: 5    5     Physical Exam: Physical Exam  Constitutional: She is oriented to person, place, and time and well-developed, well-nourished, and in no distress.  HENT:  Head: Normocephalic and atraumatic.  Right Ear: There is tenderness. No drainage or swelling. Tympanic membrane is erythematous and bulging (mild). A middle ear effusion (suppurative) is present.  Left Ear: Tympanic membrane is not erythematous. A middle ear effusion (mild serous) is present.  Nose: Nose normal.  Mouth/Throat: Uvula is midline and mucous membranes are normal. Posterior oropharyngeal erythema (mild with (+) PND) present. No oropharyngeal exudate or posterior oropharyngeal edema.  Eyes: Pupils are equal, round, and reactive to light.  Cardiovascular: Normal rate, regular rhythm, normal heart sounds and intact distal pulses.  Pulmonary/Chest: Effort normal and breath sounds normal.  No cough noted in clinic. No SOB or increased WOB. No distress. Able to speak in complete sentences without difficulties. SPO2 99% on RA.  Lymphadenopathy:       Head (right side): Submandibular adenopathy present.  Neurological: She is alert and oriented to person, place, and time. Gait normal.  Skin: Skin is warm and dry. No rash noted. She is not diaphoretic.  Psychiatric: Mood, memory, affect and judgment normal.  Nursing note and vitals  reviewed.   Urgent Care Treatments / Results:   Orders Placed This Encounter  Procedures  . SARS CORONAVIRUS 2 (TAT 6-24 HRS) Nasopharyngeal Nasopharyngeal Swab    LABS: PLEASE NOTE: all labs that were ordered this encounter are listed, however only abnormal results are displayed. Labs Reviewed  SARS CORONAVIRUS 2 (TAT 6-24 HRS)    EKG: -None  RADIOLOGY: No results found.  PROCEDURES: Procedures  MEDICATIONS RECEIVED THIS VISIT: Medications - No data to display  PERTINENT CLINICAL COURSE NOTES/UPDATES:   Initial Impression / Assessment and Plan / Urgent Care Course:  Pertinent labs & imaging results that were available during my care of the patient were personally reviewed by me and considered in my medical decision making (see lab/imaging section of note for values and interpretations).  Carolyn Robles is a 34 y.o. female who presents to [facility] today with complaints of Sinus  Problem and Sore Throat  Patient overall well appearing and in no acute distress today in clinic. Presenting symptoms (see HPI) and exam as documented above. She presents with symptoms associated with SARS-CoV-2 (novel coronavirus). Discussed typical symptom constellation. Reviewed potential for infection and need for testing. Patient amenable to being tested. SARS-CoV-2 swab collected by certified clinical staff. Discussed variable turn around times associated with testing, as swabs are being processed at the main campus of [facility] in [place], and have been taking 12-24 hours to come back. She was advised to self quarantine, per [facility] DHHS guidelines, until negative results received. These measures are being implemented out of an abundance of caution to prevent transmission and spread during the current SARS-CoV-2 pandemic.  Exam consistent with URI that has resulted in a concurrent AOME on the RIGHT. Discussed that SARS-CoV-2 remains part of the differential, however it is felt to be  unlikely. Treating with a 10 day course of amoxicillin-clavulanate.  (+) history of vulvovaginal candidiasis while on oral antimicrobial therapy; sending fluconazole Rx for PRN use (2 doses). Intervention for cough offered, however patient declined citing that her symptoms are mild/controlled. Reviewed supportive care; rest, increased hydration, warm salt water gargles, hard candies/lozenges, and hot tea with honey/lemon to help soothe the throat and reduce irritation. May use Tylenol and/or Ibuprofen as needed for pain/fever.  Current clinical condition warrants patient being out of work in order to quarantine while waiting for testing results. She was provided with the appropriate documentation to provide to her place of employment that will allow for her to RTW on 02/24/2020 with no restrictions. RTW is contingent on her SARS-CoV-2 test results being reviewed as negative.     Discussed follow up with primary care physician in 1 week for re-evaluation. I have reviewed the follow up and strict return precautions for any new or worsening symptoms. Patient is aware of symptoms that would be deemed urgent/emergent, and would thus require further evaluation either here or in the emergency department. At the time of discharge, she verbalized understanding and consent with the discharge plan as it was reviewed with her. All questions were fielded by provider and/or clinic staff prior to patient discharge.    Final Clinical Impressions / Urgent Care Diagnoses:   Final diagnoses:  Non-recurrent acute suppurative otitis media of right ear without spontaneous rupture of tympanic membrane  Acute upper respiratory infection  Encounter for laboratory testing for COVID-19 virus    New Prescriptions:  [place] Controlled Substance Registry consulted? Not Applicable  Meds ordered this encounter  Medications  . amoxicillin-clavulanate (AUGMENTIN) 875-125 MG tablet    Sig: Take 1 tablet by mouth 2 (two) times daily for  10 days.    Dispense:  20 tablet    Refill:  0  . fluconazole (DIFLUCAN) 150 MG tablet    Sig: Take 1 tablet (150 mg) PO x 1 dose. May repeat 150 mg dose in 3 days if still symptomatic.    Dispense:  2 tablet    Refill:  0    Recommended Follow up Care:  Patient encouraged to follow up with the following provider within the specified time frame, or sooner as dictated by the severity of her symptoms. As always, she was instructed that for any urgent/emergent care needs, she should seek care either here or in the emergency department for more immediate evaluation.  Follow-up Information    McLean-Scocuzza, Nino Glow, MD In 1 week.   Specialty: Internal Medicine Why: General reassessment of symptoms if  not improving Contact information: [address] [place] [address] [phone]         NOTE: This note was prepared using Dragon dictation software along with smaller phrase technology. Despite my best ability to proofread, there is the potential that transcriptional errors may still occur from this process, and are completely unintentional.    Karen Kitchens, NP 02/23/20 [phone]

## 2020-03-09 DIAGNOSIS — H60339 Swimmer's ear, unspecified ear: Secondary | ICD-10-CM | POA: Diagnosis not present

## 2020-03-09 DIAGNOSIS — H6121 Impacted cerumen, right ear: Secondary | ICD-10-CM | POA: Diagnosis not present

## 2020-03-09 DIAGNOSIS — H6063 Unspecified chronic otitis externa, bilateral: Secondary | ICD-10-CM | POA: Diagnosis not present

## 2020-03-24 ENCOUNTER — Encounter: Payer: 59 | Admitting: Obstetrics and Gynecology

## 2020-04-04 ENCOUNTER — Encounter: Payer: Self-pay | Admitting: Internal Medicine

## 2020-04-14 ENCOUNTER — Other Ambulatory Visit (HOSPITAL_COMMUNITY)
Admission: RE | Admit: 2020-04-14 | Discharge: 2020-04-14 | Disposition: A | Discharge status: Home / Self Care | Payer: 59 | Source: Ambulatory Visit | Attending: Internal Medicine | Admitting: Internal Medicine

## 2020-04-14 ENCOUNTER — Other Ambulatory Visit: Payer: Self-pay

## 2020-04-14 ENCOUNTER — Other Ambulatory Visit: Payer: Self-pay | Admitting: Internal Medicine

## 2020-04-14 ENCOUNTER — Encounter: Payer: Self-pay | Admitting: Internal Medicine

## 2020-04-14 ENCOUNTER — Ambulatory Visit (INDEPENDENT_AMBULATORY_CARE_PROVIDER_SITE_OTHER): Payer: 59 | Admitting: Internal Medicine

## 2020-04-14 VITALS — BP 106/70 | HR 75 | Temp 98.6°F | Ht 66.22 in | Wt 179.4 lb

## 2020-04-14 DIAGNOSIS — F419 Anxiety disorder, unspecified: Secondary | ICD-10-CM

## 2020-04-14 DIAGNOSIS — Z8249 Family history of ischemic heart disease and other diseases of the circulatory system: Secondary | ICD-10-CM | POA: Insufficient documentation

## 2020-04-14 DIAGNOSIS — W57XXXA Bitten or stung by nonvenomous insect and other nonvenomous arthropods, initial encounter: Secondary | ICD-10-CM

## 2020-04-14 DIAGNOSIS — Z0000 Encounter for general adult medical examination without abnormal findings: Secondary | ICD-10-CM

## 2020-04-14 DIAGNOSIS — R42 Dizziness and giddiness: Secondary | ICD-10-CM | POA: Diagnosis not present

## 2020-04-14 DIAGNOSIS — R197 Diarrhea, unspecified: Secondary | ICD-10-CM

## 2020-04-14 DIAGNOSIS — Z124 Encounter for screening for malignant neoplasm of cervix: Secondary | ICD-10-CM | POA: Insufficient documentation

## 2020-04-14 DIAGNOSIS — J309 Allergic rhinitis, unspecified: Secondary | ICD-10-CM | POA: Diagnosis not present

## 2020-04-14 DIAGNOSIS — Z8742 Personal history of other diseases of the female genital tract: Secondary | ICD-10-CM

## 2020-04-14 DIAGNOSIS — R002 Palpitations: Secondary | ICD-10-CM

## 2020-04-14 DIAGNOSIS — E663 Overweight: Secondary | ICD-10-CM

## 2020-04-14 DIAGNOSIS — S70262A Insect bite (nonvenomous), left hip, initial encounter: Secondary | ICD-10-CM

## 2020-04-14 DIAGNOSIS — R55 Syncope and collapse: Secondary | ICD-10-CM

## 2020-04-14 HISTORY — DX: Family history of ischemic heart disease and other diseases of the circulatory system: Z82.49

## 2020-04-14 MED ORDER — LEVOCETIRIZINE DIHYDROCHLORIDE 5 MG PO TABS: 5.0000 mg | ORAL_TABLET | Freq: Every evening | ORAL | 3 refills | Status: DC

## 2020-04-14 MED ORDER — MONTELUKAST SODIUM 10 MG PO TABS: 10.0000 mg | ORAL_TABLET | Freq: Every day | ORAL | 3 refills | Status: DC

## 2020-04-14 MED ORDER — ALPRAZOLAM 0.25 MG PO TABS: 0.2500 mg | ORAL_TABLET | Freq: Every day | ORAL | 2 refills | Status: DC | PRN

## 2020-04-14 NOTE — Progress Notes (Signed)
Chief Complaint  Patient presents with  . Annual Exam  . Gynecologic Exam    Requesting a pap as she states she has had a positive Pap before.    Annual  1.acne no f/u derm using clindamycin/aczone gel, retina derm wants to put her on accutane but she declines for now and allergic to doxycline 2. Tick bite to left hip recently w/in the last 2-4 weeks tick removed and small and wants to be checked for lyme 3. 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 Marcelline Mates wants to do paps yearly x 3 years from 03/2019 and then if all normal Q3 years and she wants pap today with PCP. If pap normal 03/2021 can go to every 3 years  4. Dizziness and palpitations with walking and h/o syncope and mom had MI at 38 y.o she is agreeable to cards  5. Overweight tried 18.75 mg adipex but had h/a on this and declines to try further tried to exercise but had dizziness and racing heart doing this recently see above defer to cards for w/u and she also is established with neurology Dr. Manuella Ghazi  Goal wt is 160 as this was weight before kids 6. Anxiety wants refill of xanax prn which she takes for anxiety and at times for sleep   Review of Systems  Constitutional: Negative for weight loss.  HENT: Negative for hearing loss.   Eyes: Negative for blurred vision.  Respiratory: Negative for shortness of breath.   Cardiovascular: Positive for palpitations. Negative for chest pain.  Gastrointestinal: Negative for abdominal pain.  Musculoskeletal: Negative for joint pain.  Skin: Negative for rash.       +acne to face and trunk Prior tick bite left hip  Neurological: Positive for dizziness. Negative for headaches.       +presyncope   Psychiatric/Behavioral: The patient is nervous/anxious.    Past Medical History:  Diagnosis Date  . Allergy    pollen, trees, etc   . Asthma    seasonal  . History of colposcopy    CIN 3 HPV +  . UTI (urinary tract infection)    Past Surgical History:  Procedure Laterality Date  . HYSTEROSCOPY N/A  05/04/2018   Procedure: HYSTEROSCOPY;  Surgeon: Defrancesco, Alanda Slim, MD;  Location: [facility];  Service: Gynecology;  Laterality: N/A;  . IUD REMOVAL N/A 05/04/2018   Procedure: INTRAUTERINE DEVICE (IUD) REMOVAL;  Surgeon: Brayton Mars, MD;  Location: [facility];  Service: Gynecology;  Laterality: N/A;  . LEEP     for h/o abnormal pap  . RETAINED PLACENTA REMOVAL     with vaccum suction    Family History  Problem Relation Age of Onset  . Heart disease Mother        MI died when pt was young; mom died when was 26 and mom was adopted   . Diabetes Father   . Depression Sister   . Miscarriages / Stillbirths Sister   . Alcohol abuse Brother   . Depression Brother   . Drug abuse Brother    Social History   Socioeconomic History  . Marital status: Married    Spouse name: Not on file  . Number of children: Not on file  . Years of education: Not on file  . Highest education level: Not on file  Occupational History  . Not on file  Tobacco Use  . Smoking status: Never Smoker  . Smokeless tobacco: Never Used  Vaping Use  . Vaping Use: Never used  Substance and Sexual Activity  . Alcohol use: Yes    Alcohol/week: 2.0 standard drinks    Types: 2 Cans of beer per week  . Drug use: No  . Sexual activity: Yes    Birth control/protection: None  Other Topics Concern  . Not on file  Social History Narrative   College ed    Receptionist at [employer]    Married    2 kids    Grew up in [place] with siblings   Social Determinants of Radio broadcast assistant Strain:   . Difficulty of Paying Living Expenses:   Food Insecurity:   . Worried About Charity fundraiser in the Last Year:   . Arboriculturist in the Last Year:   Transportation Needs:   . Film/video editor (Medical):   Marland Kitchen Lack of Transportation (Non-Medical):   Physical Activity:   . Days of Exercise per Week:   . Minutes of Exercise per Session:   Stress:   . Feeling of Stress :   Social Connections:   .  Frequency of Communication with Friends and Family:   . Frequency of Social Gatherings with Friends and Family:   . Attends Religious Services:   . Active Member of Clubs or Organizations:   . Attends Archivist Meetings:   Marland Kitchen Marital Status:   Intimate Partner Violence:   . Fear of Current or Ex-Partner:   . Emotionally Abused:   Marland Kitchen Physically Abused:   . Sexually Abused:    Current Meds  Medication Sig  . ACZONE 7.5 % GEL APPLY A SMALL AMOUNT TO AFFECTED AREA(S) ON THE SKIN EVERY DAY IN THE MORNING  . albuterol (VENTOLIN HFA) 108 (90 Base) MCG/ACT inhaler Inhale 1-2 puffs into the lungs every 6 (six) hours as needed for wheezing or shortness of breath.  . Cholecalciferol (VITAMIN D3 PO) Take by mouth.  . clindamycin (CLINDAGEL) 1 % gel Apply topically 2 (two) times daily.  . Cyanocobalamin (VITAMIN B12 PO) Take by mouth.  . cyclobenzaprine (FLEXERIL) 5 MG tablet Take 1 tablet (5 mg total) by mouth 3 (three) times daily as needed for muscle spasms.  . fluticasone (FLONASE) 50 MCG/ACT nasal spray Place 1 spray into both nostrils daily as needed for allergies or rhinitis.  Marland Kitchen levocetirizine (XYZAL) 5 MG tablet Take 1 tablet (5 mg total) by mouth every evening.  . meloxicam (MOBIC) 7.5 MG tablet Take 7.5 mg by mouth daily.  . montelukast (SINGULAIR) 10 MG tablet Take 1 tablet (10 mg total) by mouth at bedtime.  . tretinoin (RETIN-A) 0.025 % cream Apply topically at bedtime. With moisturizer  . [DISCONTINUED] ALPRAZolam (XANAX PO) Take by mouth daily as needed.  . [DISCONTINUED] levocetirizine (XYZAL) 5 MG tablet Take 1 tablet (5 mg total) by mouth every evening.  . [DISCONTINUED] montelukast (SINGULAIR) 10 MG tablet Take 1 tablet (10 mg total) by mouth at bedtime.   Allergies  Allergen Reactions  . Apple     Or fresh fruit throat itching   . Doxycycline    Recent Results (from the past 2160 hour(s))  SARS CORONAVIRUS 2 (TAT 6-24 HRS) Nasopharyngeal Nasopharyngeal Swab      Status: None   Collection Time: 02/22/20  8:22 AM   Specimen: Nasopharyngeal Swab  Result Value Ref Range   SARS Coronavirus 2 NEGATIVE NEGATIVE    Comment: (NOTE) SARS-CoV-2 target nucleic acids are NOT DETECTED. The SARS-CoV-2 RNA is generally detectable in upper and lower respiratory specimens during  the acute phase of infection. Negative results do not preclude SARS-CoV-2 infection, do not rule out co-infections with other pathogens, and should not be used as the sole basis for treatment or other patient management decisions. Negative results must be combined with clinical observations, patient history, and epidemiological information. The expected result is Negative. Fact Sheet for Patients: SugarRoll.be Fact Sheet for Healthcare Providers: https://www.woods-mathews.com/ This test is not yet approved or cleared by the [place] FDA and  has been authorized for detection and/or diagnosis of SARS-CoV-2 by FDA under an Emergency Use Authorization (EUA). This EUA will remain  in effect (meaning this test can be used) for the duration of the COVID-19 declaration under Section 56 4(b)(1) of the Act, 21 U.S.C. section 360bbb-3(b)(1), unless the authorization is terminated or revoked sooner. Performed at [facility], [place] [address] [place], [place] [address]    Objective  Body mass index is 28.76 kg/m. Wt Readings from Last 3 Encounters:  04/14/20 179 lb 6.4 oz (81.4 kg)  02/22/20 175 lb 0.7 oz (79.4 kg)  12/16/19 175 lb (79.4 kg)   Temp Readings from Last 3 Encounters:  04/14/20 98.6 F (37 C) (Oral)  02/22/20 98.7 F (37.1 C) (Oral)  08/18/19 97.9 F (36.6 C) (Skin)   BP Readings from Last 3 Encounters:  04/14/20 106/70  02/22/20 114/70  12/16/19 104/70   Pulse Readings from Last 3 Encounters:  04/14/20 75  02/22/20 70  12/16/19 87    Physical Exam Vitals and nursing note reviewed. Exam conducted with a  chaperone present.  Constitutional:      Appearance: Normal appearance. She is well-developed, well-groomed and overweight.  HENT:     Head: Normocephalic and atraumatic.  Eyes:     Conjunctiva/sclera: Conjunctivae normal.     Pupils: Pupils are equal, round, and reactive to light.  Cardiovascular:     Rate and Rhythm: Normal rate and regular rhythm.     Heart sounds: Normal heart sounds. No murmur heard.   Pulmonary:     Effort: Pulmonary effort is normal.     Breath sounds: Normal breath sounds.  Chest:     Breasts: Breasts are symmetrical.        Right: Normal.        Left: Normal.  Genitourinary:    Pubic Area: No rash.      Labia:        Right: No rash.        Left: No rash.      Vagina: Normal.     Cervix: Normal.     Uterus: Normal.      Adnexa: Right adnexa normal and left adnexa normal.     Comments: Scant discharge on exam  Lymphadenopathy:     Upper Body:     Right upper body: No axillary adenopathy.     Left upper body: No axillary adenopathy.  Skin:    General: Skin is warm and dry.  Neurological:     General: No focal deficit present.     Mental Status: She is alert and oriented to person, place, and time. Mental status is at baseline.     Gait: Gait normal.  Psychiatric:        Attention and Perception: Attention and perception normal.        Mood and Affect: Mood and affect normal.        Speech: Speech normal.        Behavior: Behavior normal. Behavior is cooperative.  Thought Content: Thought content normal.        Cognition and Memory: Cognition and memory normal.        Judgment: Judgment normal.     Assessment  Plan  Annual physical exam Fasting labs 03/2020 or after given lab sheet [facility] today 04/14/20 Flu shot had inlate 06/2019  Tdap in 2015/6 [facility] 07/20/2019 Consider HPV vaccine pap 03/24/2019 neg neg HPV + with CIN 3 h/o colp and LEEPDr. Marcelline Mates had pap 03/2019 neg pap neg HPV but rec yearly paps x 3  years and then if normal can do paps Q3 years  Screening mammo age 54 baseline then age 5  Colonoscopy age 14 screening covid vaccine not had yet consider   Eye exam last 2019 [facility] Dr. Glennon Mac   Overweight (BMI 25.0-29.9) rec healthy diet and exercise goal wt 160 does not like adipex gives h/a 18.75 dose   Cervical cancer screening - Plan: Cytology - PAP( [facility])  Anxiety - Plan: ALPRAZolam (XANAX) 0.25 MG tablet  Allergic rhinitis, unspecified seasonality, unspecified trigger - Plan: levocetirizine (XYZAL) 5 MG tablet, montelukast (SINGULAIR) 10 MG tablet  Palpitations Dizziness Postural dizziness with presyncope with h/o syncope FH MI mom died age 66  -refer [facility] cards  -f/u neurologyDr. Manuella Ghazi prn  Tick bite left hip  Check lyme antibodies [facility]  Cards [facility] Dr. Raliegh Ip seen 03/03/15 [facility]  Dentist Dr. Phillip Heal  Neurology Dr. Manuella Ghazi  Allergy Dr. Donneta Romberg Dermatology Dr. Laurence Ferrari appt sch 09/2019  Provider: Dr. Olivia Mackie McLean-Scocuzza-Internal Medicine

## 2020-04-14 NOTE — Addendum Note (Signed)
Addended by: Orland Mustard on: 04/14/2020 12:32 PM   Modules accepted: Orders

## 2020-04-14 NOTE — Progress Notes (Signed)
Patient is requesting a pap be completed today. Per her OBGYN from 2020 exam:  Your pap and HPV are both negative, no need for colpo we will just repeat the pap next year. Blood work looks fine too.    Patient flagged: Current status:  PATIENT IS OVERDUE FOR BMI FOLLOW UP PLAN BMI is estimated to be 28.8 based on the last recorded weight and height

## 2020-04-14 NOTE — Patient Instructions (Signed)
Panoxyl 8-10% over the counter as needed on body   High Cholesterol  High cholesterol is a condition in which the blood has high levels of a white, waxy, fat-like substance (cholesterol). The human body needs small amounts of cholesterol. The liver makes all the cholesterol that the body needs. Extra (excess) cholesterol comes from the food that we eat. Cholesterol is carried from the liver by the blood through the blood vessels. If you have high cholesterol, deposits (plaques) may build up on the walls of your blood vessels (arteries). Plaques make the arteries narrower and stiffer. Cholesterol plaques increase your risk for heart attack and stroke. Work with your health care provider to keep your cholesterol levels in a healthy range. What increases the risk? This condition is more likely to develop in people who:  Eat foods that are high in animal fat (saturated fat) or cholesterol.  Are overweight.  Are not getting enough exercise.  Have a family history of high cholesterol. What are the signs or symptoms? There are no symptoms of this condition. How is this diagnosed? This condition may be diagnosed from the results of a blood test.  If you are older than age 66, your health care provider may check your cholesterol every 4-6 years.  You may be checked more often if you already have high cholesterol or other risk factors for heart disease. The blood test for cholesterol measures:  "Bad" cholesterol (LDL cholesterol). This is the main type of cholesterol that causes heart disease. The desired level for LDL is less than 100.  "Good" cholesterol (HDL cholesterol). This type helps to protect against heart disease by cleaning the arteries and carrying the LDL away. The desired level for HDL is 60 or higher.  Triglycerides. These are fats that the body can store or burn for energy. The desired number for triglycerides is lower than 150.  Total cholesterol. This is a measure of the total  amount of cholesterol in your blood, including LDL cholesterol, HDL cholesterol, and triglycerides. A healthy number is less than 200. How is this treated? This condition is treated with diet changes, lifestyle changes, and medicines. Diet changes  This may include eating more whole grains, fruits, vegetables, nuts, and fish.  This may also include cutting back on red meat and foods that have a lot of added sugar. Lifestyle changes  Changes may include getting at least 40 minutes of aerobic exercise 3 times a week. Aerobic exercises include walking, biking, and swimming. Aerobic exercise along with a healthy diet can help you maintain a healthy weight.  Changes may also include quitting smoking. Medicines  Medicines are usually given if diet and lifestyle changes have failed to reduce your cholesterol to healthy levels.  Your health care provider may prescribe a statin medicine. Statin medicines have been shown to reduce cholesterol, which can reduce the risk of heart disease. Follow these instructions at home: Eating and drinking If told by your health care provider:  Eat chicken (without skin), fish, veal, shellfish, ground [place] breast, and round or loin cuts of red meat.  Do not eat fried foods or fatty meats, such as hot dogs and salami.  Eat plenty of fruits, such as apples.  Eat plenty of vegetables, such as broccoli, potatoes, and carrots.  Eat beans, peas, and lentils.  Eat grains such as barley, rice, couscous, and bulgur wheat.  Eat pasta without cream sauces.  Use skim or nonfat milk, and eat low-fat or nonfat yogurt and cheeses.  Do  not eat or drink whole milk, cream, ice cream, egg yolks, or hard cheeses.  Do not eat stick margarine or tub margarines that contain trans fats (also called partially hydrogenated oils).  Do not eat saturated tropical oils, such as coconut oil and palm oil.  Do not eat cakes, cookies, crackers, or other baked goods that contain  trans fats.  General instructions  Exercise as directed by your health care provider. Increase your activity level with activities such as gardening, walking, and taking the stairs.  Take over-the-counter and prescription medicines only as told by your health care provider.  Do not use any products that contain nicotine or tobacco, such as cigarettes and e-cigarettes. If you need help quitting, ask your health care provider.  Keep all follow-up visits as told by your health care provider. This is important. Contact a health care provider if:  You are struggling to maintain a healthy diet or weight.  You need help to start on an exercise program.  You need help to stop smoking. Get help right away if:  You have chest pain.  You have trouble breathing. This information is not intended to replace advice given to you by your health care provider. Make sure you discuss any questions you have with your health care provider. Document Revised: 10/03/2017 Document Reviewed: 03/30/2016 Elsevier Patient Education  [place].  Cholesterol Content in Foods Cholesterol is a waxy, fat-like substance that helps to carry fat in the blood. The body needs cholesterol in small amounts, but too much cholesterol can cause damage to the arteries and heart. Most people should eat less than 200 milligrams (mg) of cholesterol a day. Foods with cholesterol  Cholesterol is found in animal-based foods, such as meat, seafood, and dairy. Generally, low-fat dairy and lean meats have less cholesterol than full-fat dairy and fatty meats. The milligrams of cholesterol per serving (mg per serving) of common cholesterol-containing foods are listed below. Meat and other proteins  Egg -- one large whole egg has 186 mg.  Veal shank -- 4 oz has 141 mg.  Lean ground [place] (93% lean) -- 4 oz has 118 mg.  Fat-trimmed lamb loin -- 4 oz has 106 mg.  Lean ground beef (90% lean) -- 4 oz has 100 mg.  Lobster  -- 3.5 oz has 90 mg.  Pork loin chops -- 4 oz has 86 mg.  Canned salmon -- 3.5 oz has 83 mg.  Fat-trimmed beef top loin -- 4 oz has 78 mg.  Frankfurter -- 1 frank (3.5 oz) has 77 mg.  Crab -- 3.5 oz has 71 mg.  Roasted chicken without skin, white meat -- 4 oz has 66 mg.  Light bologna -- 2 oz has 45 mg.  Deli-cut [place] -- 2 oz has 31 mg.  Canned tuna -- 3.5 oz has 31 mg.  Berniece Salines -- 1 oz has 29 mg.  Oysters and mussels (raw) -- 3.5 oz has 25 mg.  Mackerel -- 1 oz has 22 mg.  Trout -- 1 oz has 20 mg.  Pork sausage -- 1 link (1 oz) has 17 mg.  Salmon -- 1 oz has 16 mg.  Tilapia -- 1 oz has 14 mg. Dairy  Soft-serve ice cream --  cup (4 oz) has 103 mg.  Whole-milk yogurt -- 1 cup (8 oz) has 29 mg.  Cheddar cheese -- 1 oz has 28 mg.  American cheese -- 1 oz has 28 mg.  Whole milk -- 1 cup (8 oz) has 23  mg.  2% milk -- 1 cup (8 oz) has 18 mg.  Cream cheese -- 1 tablespoon (Tbsp) has 15 mg.  Cottage cheese --  cup (4 oz) has 14 mg.  Low-fat (1%) milk -- 1 cup (8 oz) has 10 mg.  Sour cream -- 1 Tbsp has 8.5 mg.  Low-fat yogurt -- 1 cup (8 oz) has 8 mg.  Nonfat Greek yogurt -- 1 cup (8 oz) has 7 mg.  Half-and-half cream -- 1 Tbsp has 5 mg. Fats and oils  Cod liver oil -- 1 tablespoon (Tbsp) has 82 mg.  Butter -- 1 Tbsp has 15 mg.  Lard -- 1 Tbsp has 14 mg.  Bacon grease -- 1 Tbsp has 14 mg.  Mayonnaise -- 1 Tbsp has 5-10 mg.  Margarine -- 1 Tbsp has 3-10 mg. Exact amounts of cholesterol in these foods may vary depending on specific ingredients and brands. Foods without cholesterol Most plant-based foods do not have cholesterol unless you combine them with a food that has cholesterol. Foods without cholesterol include:  Grains and cereals.  Vegetables.  Fruits.  Vegetable oils, such as olive, canola, and sunflower oil.  Legumes, such as peas, beans, and lentils.  Nuts and seeds.  Egg whites. Summary  The body needs cholesterol in  small amounts, but too much cholesterol can cause damage to the arteries and heart.  Most people should eat less than 200 milligrams (mg) of cholesterol a day. This information is not intended to replace advice given to you by your health care provider. Make sure you discuss any questions you have with your health care provider. Document Revised: 09/12/2017 Document Reviewed: 05/27/2017 Elsevier Patient Education  [place].

## 2020-04-18 DIAGNOSIS — R87618 Other abnormal cytological findings on specimens from cervix uteri: Secondary | ICD-10-CM | POA: Diagnosis not present

## 2020-04-18 DIAGNOSIS — E785 Hyperlipidemia, unspecified: Secondary | ICD-10-CM | POA: Diagnosis not present

## 2020-04-18 DIAGNOSIS — R202 Paresthesia of skin: Secondary | ICD-10-CM | POA: Diagnosis not present

## 2020-04-18 DIAGNOSIS — R200 Anesthesia of skin: Secondary | ICD-10-CM | POA: Diagnosis not present

## 2020-04-18 DIAGNOSIS — S80862D Insect bite (nonvenomous), left lower leg, subsequent encounter: Secondary | ICD-10-CM | POA: Diagnosis not present

## 2020-04-18 DIAGNOSIS — W57XXXD Bitten or stung by nonvenomous insect and other nonvenomous arthropods, subsequent encounter: Secondary | ICD-10-CM | POA: Diagnosis not present

## 2020-04-18 DIAGNOSIS — M25532 Pain in left wrist: Secondary | ICD-10-CM | POA: Diagnosis not present

## 2020-04-18 DIAGNOSIS — Z0000 Encounter for general adult medical examination without abnormal findings: Secondary | ICD-10-CM | POA: Diagnosis not present

## 2020-04-18 DIAGNOSIS — M25531 Pain in right wrist: Secondary | ICD-10-CM | POA: Diagnosis not present

## 2020-04-18 DIAGNOSIS — E663 Overweight: Secondary | ICD-10-CM | POA: Diagnosis not present

## 2020-04-19 LAB — COMPREHENSIVE METABOLIC PANEL
ALT: 14 IU/L (ref 0–32)
AST: 16 IU/L (ref 0–40)
Albumin/Globulin Ratio: 1.8 (ref 1.2–2.2)
Albumin: 4.4 g/dL (ref 3.8–4.8)
Alkaline Phosphatase: 45 IU/L — ABNORMAL LOW (ref 48–121)
BUN/Creatinine Ratio: 19 (ref 9–23)
BUN: 13 mg/dL (ref 6–20)
Bilirubin Total: 0.4 mg/dL (ref 0.0–1.2)
CO2: 22 mmol/L (ref 20–29)
Calcium: 8.9 mg/dL (ref 8.7–10.2)
Chloride: 104 mmol/L (ref 96–106)
Creatinine, Ser: 0.69 mg/dL (ref 0.57–1.00)
GFR calc Af Amer: 132 mL/min/{1.73_m2} (ref >59)
GFR calc non Af Amer: 115 mL/min/{1.73_m2} (ref >59)
Globulin, Total: 2.4 g/dL (ref 1.5–4.5)
Glucose: 100 mg/dL — ABNORMAL HIGH (ref 65–99)
Potassium: 4.3 mmol/L (ref 3.5–5.2)
Sodium: 139 mmol/L (ref 134–144)
Total Protein: 6.8 g/dL (ref 6.0–8.5)

## 2020-04-19 LAB — CBC WITH DIFFERENTIAL/PLATELET
Basophils Absolute: 0 10*3/uL (ref 0.0–0.2)
Basos: 0 %
EOS (ABSOLUTE): 0.2 10*3/uL (ref 0.0–0.4)
Eos: 4 %
Hematocrit: 37.1 % (ref 34.0–46.6)
Hemoglobin: 12.8 g/dL (ref 11.1–15.9)
Immature Grans (Abs): 0 10*3/uL (ref 0.0–0.1)
Immature Granulocytes: 0 %
Lymphocytes Absolute: 1.7 10*3/uL (ref 0.7–3.1)
Lymphs: 28 %
MCH: 28.7 pg (ref 26.6–33.0)
MCHC: 34.5 g/dL (ref 31.5–35.7)
MCV: 83 fL (ref 79–97)
Monocytes Absolute: 0.3 10*3/uL (ref 0.1–0.9)
Monocytes: 5 %
Neutrophils Absolute: 3.8 10*3/uL (ref 1.4–7.0)
Neutrophils: 63 %
Platelets: 251 10*3/uL (ref 150–450)
RBC: 4.46 x10E6/uL (ref 3.77–5.28)
RDW: 13.3 % (ref 11.7–15.4)
WBC: 6 10*3/uL (ref 3.4–10.8)

## 2020-04-19 LAB — LIPID PANEL
Chol/HDL Ratio: 4.2 ratio (ref 0.0–4.4)
Cholesterol, Total: 190 mg/dL (ref 100–199)
HDL: 45 mg/dL (ref >39)
LDL Chol Calc (NIH): 129 mg/dL — ABNORMAL HIGH (ref 0–99)
Triglycerides: 89 mg/dL (ref 0–149)
VLDL Cholesterol Cal: 16 mg/dL (ref 5–40)

## 2020-04-19 LAB — B. BURGDORFI ANTIBODIES: Lyme IgG/IgM Ab: <0.91 {ISR} (ref 0.00–0.90)

## 2020-04-20 LAB — CYTOLOGY - PAP
Comment: NEGATIVE
Diagnosis: NEGATIVE
Diagnosis: REACTIVE
High risk HPV: NEGATIVE

## 2020-04-21 ENCOUNTER — Ambulatory Visit (INDEPENDENT_AMBULATORY_CARE_PROVIDER_SITE_OTHER): Payer: 59 | Admitting: Cardiovascular Disease

## 2020-04-21 ENCOUNTER — Encounter: Payer: Self-pay | Admitting: Cardiovascular Disease

## 2020-04-21 ENCOUNTER — Ambulatory Visit (INDEPENDENT_AMBULATORY_CARE_PROVIDER_SITE_OTHER): Payer: 59

## 2020-04-21 ENCOUNTER — Other Ambulatory Visit: Payer: Self-pay

## 2020-04-21 VITALS — BP 102/67 | HR 84 | Ht 66.0 in | Wt 179.2 lb

## 2020-04-21 DIAGNOSIS — R079 Chest pain, unspecified: Secondary | ICD-10-CM | POA: Diagnosis not present

## 2020-04-21 DIAGNOSIS — R002 Palpitations: Secondary | ICD-10-CM | POA: Diagnosis not present

## 2020-04-21 DIAGNOSIS — R0602 Shortness of breath: Secondary | ICD-10-CM | POA: Diagnosis not present

## 2020-04-21 DIAGNOSIS — E785 Hyperlipidemia, unspecified: Secondary | ICD-10-CM | POA: Diagnosis not present

## 2020-04-21 NOTE — Progress Notes (Signed)
Cardiology Office Note   Date:  04/21/2020   ID:  Carolyn Robles, DOB 28-Feb-1986, MRN [id number]  PCP:  McLean-Scocuzza, Nino Glow, MD  Cardiologist:   Kathlyn Sacramento, MD   Chief Complaint  Patient presents with  . New Patient (Initial Visit)    Pt hands and legs swelling/ dizziness/ fatigue/ chest pain/ discomfort/ rapid heartbeat. Meds verbally reviewed w/ pt.      History of Present Illness: Carolyn Robles is a 34 y.o. female who was referred by Dr. Terese Door for evaluation of palpitations and shortness of breath.  She has no prior cardiac history.  She has been relatively healthy throughout her life.  She was found to have mild hyperlipidemia recently.  Patient is concerned about her family history.  Her mother died suddenly at the age of 22 of unknown causes.  She was a smoker.  No clear history of premature coronary artery disease.  The patient is not a smoker.  She reports intermittent episodes of palpitations and tachycardia mostly at night when she is trying to sleep.  She has occasional episodes of dizziness.  She also describes exertional dyspnea and occasional substernal chest discomfort described as sharp pain lasting for few seconds.  She does not exercise on a regular basis but tries to walk her dog in the morning for 15 minutes.  No syncope or presyncope.  She was seen by Dr. Nehemiah Massed in 2016 for shortness of breath.  EKG was normal at that time and she had no further work-up.  Past Medical History:  Diagnosis Date  . Allergy    pollen, trees, etc   . Asthma    seasonal  . History of colposcopy    CIN 3 HPV +  . Tick bite of left hip    03/2020  . UTI (urinary tract infection)     Past Surgical History:  Procedure Laterality Date  . HYSTEROSCOPY N/A 05/04/2018   Procedure: HYSTEROSCOPY;  Surgeon: Defrancesco, Alanda Slim, MD;  Location: [facility] ORS;  Service: Gynecology;  Laterality: N/A;  . IUD REMOVAL N/A 05/04/2018   Procedure: INTRAUTERINE DEVICE  (IUD) REMOVAL;  Surgeon: Brayton Mars, MD;  Location: [facility] ORS;  Service: Gynecology;  Laterality: N/A;  . LEEP     for h/o abnormal pap  . RETAINED PLACENTA REMOVAL     with vaccum suction      Current Outpatient Medications  Medication Sig Dispense Refill  . ACZONE 7.5 % GEL APPLY A SMALL AMOUNT TO AFFECTED AREA(S) ON THE SKIN EVERY DAY IN THE MORNING    . albuterol (VENTOLIN HFA) 108 (90 Base) MCG/ACT inhaler Inhale 1-2 puffs into the lungs every 6 (six) hours as needed for wheezing or shortness of breath. 18 g 12  . ALPRAZolam (XANAX) 0.25 MG tablet Take 1 tablet (0.25 mg total) by mouth daily as needed. 30 tablet 2  . Cholecalciferol (VITAMIN D3 PO) Take by mouth.    . clindamycin (CLINDAGEL) 1 % gel Apply topically 2 (two) times daily. 30 g 11  . Cyanocobalamin (VITAMIN B12 PO) Take by mouth.    . cyclobenzaprine (FLEXERIL) 5 MG tablet Take 1 tablet (5 mg total) by mouth 3 (three) times daily as needed for muscle spasms. 30 tablet 2  . fluticasone (FLONASE) 50 MCG/ACT nasal spray Place 1 spray into both nostrils daily as needed for allergies or rhinitis.    Marland Kitchen levocetirizine (XYZAL) 5 MG tablet Take 1 tablet (5 mg total) by mouth every evening. 90 tablet  3  . meloxicam (MOBIC) 7.5 MG tablet Take 7.5 mg by mouth daily.    . montelukast (SINGULAIR) 10 MG tablet Take 1 tablet (10 mg total) by mouth at bedtime. 90 tablet 3  . tretinoin (RETIN-A) 0.025 % cream Apply topically at bedtime. With moisturizer 45 g 11   No current facility-administered medications for this visit.    Allergies:   Apple and Doxycycline    Social History:  The patient  reports that she has never smoked. She has never used smokeless tobacco. She reports current alcohol use of about 2.0 standard drinks of alcohol per week. She reports that she does not use drugs.   Family History:  The patient's family history includes Alcohol abuse in her brother; Depression in her brother and sister; Diabetes in her  father; Drug abuse in her brother; Heart disease in her mother; Miscarriages / [place] in her sister.    ROS:  Please see the history of present illness.   Otherwise, review of systems are positive for none.   All other systems are reviewed and negative.    PHYSICAL EXAM: VS:  BP 102/67 (BP Location: Right Arm, Patient Position: Sitting, Cuff Size: Normal)   Pulse 84   Ht 5\' 6"  (1.676 m)   Wt 179 lb 4 oz (81.3 kg)   SpO2 98%   BMI 28.93 kg/m  , BMI Body mass index is 28.93 kg/m. GEN: Well nourished, well developed, in no acute distress  HEENT: normal  Neck: no JVD, carotid bruits, or masses Cardiac: RRR; no murmurs, rubs, or gallops,no edema  Respiratory:  clear to auscultation bilaterally, normal work of breathing GI: soft, nontender, nondistended, + BS MS: no deformity or atrophy  Skin: warm and dry, no rash Neuro:  Strength and sensation are intact Psych: euthymic mood, full affect   EKG:  EKG is ordered today. The ekg ordered today demonstrates normal sinus rhythm with no significant ST or T wave changes.  Normal PR and QT intervals.   Recent Labs: 08/19/2019: TSH 1.550 04/18/2020: ALT 14; BUN 13; Creatinine, Ser 0.69; Hemoglobin 12.8; Platelets 251; Potassium 4.3; Sodium 139    Lipid Panel    Component Value Date/Time   CHOL 190 04/18/2020 0926   TRIG 89 04/18/2020 0926   HDL 45 04/18/2020 0926   CHOLHDL 4.2 04/18/2020 0926   LDLCALC 129 (H) 04/18/2020 0926      Wt Readings from Last 3 Encounters:  04/21/20 179 lb 4 oz (81.3 kg)  04/14/20 179 lb 6.4 oz (81.4 kg)  02/22/20 175 lb 0.7 oz (79.4 kg)      No flowsheet data found.    ASSESSMENT AND PLAN:  1.  Palpitations: Could be due to premature beats or short runs of SVT.  I requested a 2-week outpatient monitor.  Anxiety might be contributing.  2.  Shortness of breath and atypical chest pain: No significant risk factors for coronary artery disease.  I requested an echocardiogram to ensure no  structural or valvular heart abnormalities.  3.  Screening for cardiovascular disease: Although her mother died suddenly at the age of 55, the cause of death was not known.  It was presumed to be cardiac but it could have been other causes as well.  At the age of 37 and being non-smoker, there is very little utility from screening for atherosclerosis.  I suggested considering CT calcium scoring after the age of 34 years old.  4.  Hyperlipidemia: Overall mild. ASVD 10-year risk is overall low  and thus for now recommend healthy lifestyle changes.    Disposition:   FU with me as needed if cardiac testing is abnormal.  Signed,  Kathlyn Sacramento, MD  04/21/2020 8:37 AM    [facility]

## 2020-04-21 NOTE — Patient Instructions (Signed)
Medication Instructions:  Your physician recommends that you continue on your current medications as directed. Please refer to the Current Medication list given to you today.  *If you need a refill on your cardiac medications before your next appointment, please call your pharmacy*   Lab Work: None ordered If you have labs (blood work) drawn today and your tests are completely normal, you will receive your results only by: Marland Kitchen MyChart Message (if you have MyChart) OR . A paper copy in the mail If you have any lab test that is abnormal or we need to change your treatment, we will call you to review the results.   Testing/Procedures: Your physician has requested that you have an echocardiogram. Echocardiography is a painless test that uses sound waves to create images of your heart. It provides your doctor with information about the size and shape of your heart and how well your heart's chambers and valves are working. This procedure takes approximately one hour. There are no restrictions for this procedure.  Your physician has recommended that you wear a Zio monitor. (To be worn for 14 days) This monitor is a medical device that records the heart's electrical activity. Doctors most often use these monitors to diagnose arrhythmias. Arrhythmias are problems with the speed or rhythm of the heartbeat. The monitor is a small device applied to your chest. You can wear one while you do your normal daily activities. While wearing this monitor if you have any symptoms to push the button and record what you felt. Once you have worn this monitor for the period of time provider prescribed (Usually 14 days), you will return the monitor device in the postage paid box. Once it is returned they will download the data collected and provide [place] with a report which the provider will then review and we will call you with those results. Important tips:  1. Avoid showering during the first 24 hours of wearing the  monitor. 2. Avoid excessive sweating to help maximize wear time. 3. Do not submerge the device, no hot tubs, and no swimming pools. 4. Keep any lotions or oils away from the patch. 5. After 24 hours you may shower with the patch on. Take brief showers with your back facing the shower head.  6. Do not remove patch once it has been placed because that will interrupt data and decrease adhesive wear time. 7. Push the button when you have any symptoms and write down what you were feeling. 8. Once you have completed wearing your monitor, remove and place into box which has postage paid and place in your outgoing mailbox.  9. If for some reason you have misplaced your box then call our office and we can provide another box and/or mail it off for you.         Follow-Up: At [facility], you and your health needs are our priority.  As part of our continuing mission to provide you with exceptional heart care, we have created designated Provider Care Teams.  These Care Teams include your primary Cardiologist (physician) and Advanced Practice Providers (APPs -  Physician Assistants and Nurse Practitioners) who all work together to provide you with the care you need, when you need it.  We recommend signing up for the patient portal called "MyChart".  Sign up information is provided on this After Visit Summary.  MyChart is used to connect with patients for Virtual Visits (Telemedicine).  Patients are able to view lab/test results, encounter notes, upcoming appointments, etc.  Non-urgent messages can be sent to your provider as well.   To learn more about what you can do with MyChart, go to [email or web address].    Your next appointment:   As needed  The format for your next appointment:   In Person  Provider:    You may see  Dr. Fletcher Anon or one of the following Advanced Practice Providers on your designated Care Team:    Murray Hodgkins, NP  Christell Faith, PA-C  Marrianne Mood,  PA-C    Other Instructions N/A

## 2020-04-27 ENCOUNTER — Ambulatory Visit: Payer: 59 | Admitting: Internal Medicine

## 2020-05-05 ENCOUNTER — Telehealth: Payer: Self-pay | Admitting: Physician Assistant

## 2020-05-05 ENCOUNTER — Other Ambulatory Visit: Payer: Self-pay | Admitting: Physician Assistant

## 2020-05-05 ENCOUNTER — Encounter: Payer: Self-pay | Admitting: Internal Medicine

## 2020-05-05 DIAGNOSIS — N3001 Acute cystitis with hematuria: Secondary | ICD-10-CM

## 2020-05-05 MED ORDER — FLUCONAZOLE 150 MG PO TABS: 150.0000 mg | ORAL_TABLET | Freq: Once | ORAL | 0 refills | Status: AC

## 2020-05-05 MED ORDER — NITROFURANTOIN MONOHYD MACRO 100 MG PO CAPS: 100.0000 mg | ORAL_CAPSULE | Freq: Two times a day (BID) | ORAL | 0 refills | Status: AC

## 2020-05-05 NOTE — Telephone Encounter (Signed)
Patient reports a 1 day history of dysuria and urinary frequency.  No history of recurrent UTI, LMP 2 weeks ago.  In-office UA today positive for trace-intact blood and trace protein; urine microscopy with >30 WBCs/HPF, 3-10 RBCs/HPF, and >10 epithelial cells.   Urine sample contaminated, however pyuria and microscopic hematuria are consistent with UTI.  Will send for culture and start empiric Macrobid today.  We will plan for repeat UA in 1 week to prove resolution of MH.

## 2020-05-06 DIAGNOSIS — R002 Palpitations: Secondary | ICD-10-CM | POA: Diagnosis not present

## 2020-05-08 ENCOUNTER — Encounter: Payer: Self-pay | Admitting: Internal Medicine

## 2020-05-08 ENCOUNTER — Ambulatory Visit
Admission: EM | Admit: 2020-05-08 | Discharge: 2020-05-08 | Disposition: A | Discharge status: Home / Self Care | Payer: 59 | Attending: Family Medicine | Admitting: Family Medicine

## 2020-05-08 ENCOUNTER — Other Ambulatory Visit: Payer: Self-pay

## 2020-05-08 DIAGNOSIS — U071 COVID-19: Secondary | ICD-10-CM | POA: Insufficient documentation

## 2020-05-08 DIAGNOSIS — Z791 Long term (current) use of non-steroidal anti-inflammatories (NSAID): Secondary | ICD-10-CM | POA: Diagnosis not present

## 2020-05-08 DIAGNOSIS — J069 Acute upper respiratory infection, unspecified: Secondary | ICD-10-CM

## 2020-05-08 DIAGNOSIS — J45909 Unspecified asthma, uncomplicated: Secondary | ICD-10-CM | POA: Diagnosis not present

## 2020-05-08 DIAGNOSIS — Z79899 Other long term (current) drug therapy: Secondary | ICD-10-CM | POA: Diagnosis not present

## 2020-05-08 DIAGNOSIS — Z881 Allergy status to other antibiotic agents status: Secondary | ICD-10-CM | POA: Diagnosis not present

## 2020-05-08 DIAGNOSIS — Z87440 Personal history of urinary (tract) infections: Secondary | ICD-10-CM | POA: Insufficient documentation

## 2020-05-08 HISTORY — DX: COVID-19: U07.1

## 2020-05-08 LAB — SARS CORONAVIRUS 2 (TAT 6-24 HRS): SARS Coronavirus 2: POSITIVE — AB

## 2020-05-08 NOTE — ED Provider Notes (Signed)
[facility]    CSN: [id number] Arrival date & time: 05/08/20  0813      History   Chief Complaint Chief Complaint  Patient presents with  . Headache    HPI Carolyn Robles is a 34 y.o. female.   34 yo female with a c/o cough, body aches, headaches, fatigue since yesterday. States today she woke up with loss of taste and smell. Patient states she's not vaccinated for covid.    Headache   Past Medical History:  Diagnosis Date  . Allergy    pollen, trees, etc   . Asthma    seasonal  . History of colposcopy    CIN 3 HPV +  . Tick bite of left hip    03/2020  . UTI (urinary tract infection)     Patient Active Problem List   Diagnosis Date Noted  . Annual physical exam 04/14/2020  . FH: heart attack 04/14/2020  . History of abnormal cervical Pap smear 04/14/2020  . Allergic rhinitis 04/14/2020  . Acne vulgaris 10/29/2019  . Pain in both wrists 10/29/2019  . Overweight (BMI 25.0-29.9) 08/24/2019  . Polyarthralgia 08/24/2019  . Cyclic citrullinated peptide (CCP) antibody positive 08/24/2019  . Confusion 08/24/2019  . B12 deficiency 08/24/2019  . CIN III (cervical intraepithelial neoplasia grade III) with severe dysplasia 02/27/2018  . Asthma, well controlled 10/07/2015    Past Surgical History:  Procedure Laterality Date  . HYSTEROSCOPY N/A 05/04/2018   Procedure: HYSTEROSCOPY;  Surgeon: Defrancesco, Alanda Slim, MD;  Location: [facility] ORS;  Service: Gynecology;  Laterality: N/A;  . IUD REMOVAL N/A 05/04/2018   Procedure: INTRAUTERINE DEVICE (IUD) REMOVAL;  Surgeon: Brayton Mars, MD;  Location: [facility] ORS;  Service: Gynecology;  Laterality: N/A;  . LEEP     for h/o abnormal pap  . RETAINED PLACENTA REMOVAL     with vaccum suction     OB History    Gravida  2   Para  2   Term  2   Preterm  0   AB  0   Living  2     SAB  0   TAB  0   Ectopic  0   Multiple  0   Live Births  2            Home Medications    Prior to  Admission medications   Medication Sig Start Date End Date Taking? Authorizing Provider  ACZONE 7.5 % GEL APPLY A SMALL AMOUNT TO AFFECTED AREA(S) ON THE SKIN EVERY DAY IN THE MORNING 08/12/19  Yes [provider]  albuterol (VENTOLIN HFA) 108 (90 Base) MCG/ACT inhaler Inhale 1-2 puffs into the lungs every 6 (six) hours as needed for wheezing or shortness of breath. 08/18/19  Yes McLean-Scocuzza, Nino Glow, MD  ALPRAZolam Duanne Moron) 0.25 MG tablet Take 1 tablet (0.25 mg total) by mouth daily as needed. 04/14/20  Yes McLean-Scocuzza, Nino Glow, MD  Cholecalciferol (VITAMIN D3 PO) Take by mouth.   Yes [provider]  clindamycin (CLINDAGEL) 1 % gel Apply topically 2 (two) times daily. 10/29/19  Yes McLean-Scocuzza, Nino Glow, MD  Cyanocobalamin (VITAMIN B12 PO) Take by mouth.   Yes [provider]  cyclobenzaprine (FLEXERIL) 5 MG tablet Take 1 tablet (5 mg total) by mouth 3 (three) times daily as needed for muscle spasms. 03/24/19  Yes Shambley, Melody N, CNM  fluticasone (FLONASE) 50 MCG/ACT nasal spray Place 1 spray into both nostrils daily as needed for allergies or rhinitis.  Yes [provider]  levocetirizine (XYZAL) 5 MG tablet Take 1 tablet (5 mg total) by mouth every evening. 04/14/20  Yes McLean-Scocuzza, Nino Glow, MD  meloxicam (MOBIC) 7.5 MG tablet Take 7.5 mg by mouth daily.   Yes [provider]  montelukast (SINGULAIR) 10 MG tablet Take 1 tablet (10 mg total) by mouth at bedtime. 04/14/20  Yes McLean-Scocuzza, Nino Glow, MD  nitrofurantoin, macrocrystal-monohydrate, (MACROBID) 100 MG capsule Take 1 capsule (100 mg total) by mouth every 12 (twelve) hours for 5 days. 05/05/20 05/10/20 Yes Vaillancourt, Samantha, PA-C  tretinoin (RETIN-A) 0.025 % cream Apply topically at bedtime. With moisturizer 10/29/19  Yes McLean-Scocuzza, Nino Glow, MD  phentermine (ADIPEX-P) 37.5 MG tablet Take 0.5-1 tablets (18.75-37.5 mg total) by mouth daily before breakfast. 12/03/19 02/22/20   McLean-Scocuzza, Nino Glow, MD    Family History Family History  Problem Relation Age of Onset  . Heart disease Mother        MI died when pt was young; mom died when was 56 and mom was adopted   . Diabetes Father   . Depression Sister   . Miscarriages / Stillbirths Sister   . Alcohol abuse Brother   . Depression Brother   . Drug abuse Brother     Social History Social History   Tobacco Use  . Smoking status: Never Smoker  . Smokeless tobacco: Never Used  Vaping Use  . Vaping Use: Never used  Substance Use Topics  . Alcohol use: Yes    Alcohol/week: 2.0 standard drinks    Types: 2 Cans of beer per week  . Drug use: No     Allergies   Apple and Doxycycline   Review of Systems Review of Systems  Neurological: Positive for headaches.     Physical Exam Triage Vital Signs ED Triage Vitals  Enc Vitals Group     BP 05/08/20 0826 (!) 104/86     Pulse Rate 05/08/20 0826 74     Resp 05/08/20 0826 18     Temp 05/08/20 0826 98.9 F (37.2 C)     Temp Source 05/08/20 0826 Oral     SpO2 05/08/20 0826 99 %     Weight 05/08/20 0823 175 lb (79.4 kg)     Height 05/08/20 0823 5\' 6"  (1.676 m)     Head Circumference --      Peak Flow --      Pain Score 05/08/20 0823 5     Pain Loc --      Pain Edu? --      Excl. in [place]? --    No data found.  Updated Vital Signs BP (!) 104/86 (BP Location: Left Arm)   Pulse 74   Temp 98.9 F (37.2 C) (Oral)   Resp 18   Ht 5\' 6"  (1.676 m)   Wt 79.4 kg   LMP 04/17/2020   SpO2 99%   BMI 28.25 kg/m   Visual Acuity Right Eye Distance:   Left Eye Distance:   Bilateral Distance:    Right Eye Near:   Left Eye Near:    Bilateral Near:     Physical Exam Vitals and nursing note reviewed.  Constitutional:      General: She is not in acute distress.    Appearance: She is not toxic-appearing or diaphoretic.  HENT:     Right Ear: Tympanic membrane normal.     Left Ear: Tympanic membrane normal.  Cardiovascular:     Rate and  Rhythm: Normal rate.  Heart sounds: Normal heart sounds.  Pulmonary:     Effort: Pulmonary effort is normal. No respiratory distress.     Breath sounds: Normal breath sounds. No stridor. No wheezing, rhonchi or rales.  Musculoskeletal:     Cervical back: Neck supple.  Neurological:     Mental Status: She is alert.      UC Treatments / Results  Labs (all labs ordered are listed, but only abnormal results are displayed) Labs Reviewed  SARS CORONAVIRUS 2 (TAT 6-24 HRS)    EKG   Radiology No results found.  Procedures Procedures (including critical care time)  Medications Ordered in UC Medications - No data to display  Initial Impression / Assessment and Plan / UC Course  I have reviewed the triage vital signs and the nursing notes.  Pertinent labs & imaging results that were available during my care of the patient were reviewed by me and considered in my medical decision making (see chart for details).      Final Clinical Impressions(s) / UC Diagnoses   Final diagnoses:  Viral URI    ED Prescriptions    None     1. diagnosis reviewed with patient 2. Recommend supportive treatment with rest, fluids, otc meds prn 3. Await covid test 4. Follow-up prn if symptoms worsen or don't improve   PDMP not reviewed this encounter.   Norval Gable, MD 05/08/20 [phone]

## 2020-05-08 NOTE — ED Triage Notes (Signed)
Patient states that she has been having a cough, body aches and headaches with fatigue since yesterday. States that she woke up this morning without taste and smell.

## 2020-05-09 ENCOUNTER — Encounter: Payer: 59 | Admitting: Obstetrics and Gynecology

## 2020-05-09 ENCOUNTER — Telehealth: Payer: Self-pay | Admitting: Adult Health

## 2020-05-09 NOTE — Telephone Encounter (Signed)
Called and discussed with patient her eligibility to receive monoclonal antibody therapy for covid 19 as she meets criteria for an at risk group based on her bmi greater than 25.  She does not want to receive it at this time, but plans to research it and will call us back.  She understands that she has a 10 day window from symptom onset to receive therapy.    Wilber Bihari, NP

## 2020-05-18 ENCOUNTER — Telehealth: Payer: Self-pay

## 2020-05-18 NOTE — Telephone Encounter (Signed)
-----   Message from Wellington Hampshire, MD sent at 05/18/2020  3:47 PM EDT ----- Normal monitor. No evidence of arrhythmia.

## 2020-05-18 NOTE — Telephone Encounter (Signed)
Patient made aware of zio monitor results with verbalized understanding.

## 2020-05-19 ENCOUNTER — Other Ambulatory Visit: Payer: 59

## 2020-06-25 ENCOUNTER — Other Ambulatory Visit: Payer: Self-pay

## 2020-06-25 ENCOUNTER — Ambulatory Visit
Admission: EM | Admit: 2020-06-25 | Discharge: 2020-06-25 | Disposition: A | Discharge status: Home / Self Care | Payer: BC Managed Care – PPO

## 2020-06-25 DIAGNOSIS — H9201 Otalgia, right ear: Secondary | ICD-10-CM

## 2020-06-25 NOTE — ED Provider Notes (Signed)
[facility]    CSN: [id number] Arrival date & time: 06/25/20  0820      History   Chief Complaint Chief Complaint  Patient presents with   Otalgia    right    HPI Carolyn Robles is a 34 y.o. female.   Patient presents with right ear pain x3 days.  She had some leftover antibiotics (cefdinir) which she took 3-4 doses.  She denies fever, chills, congestion, cough, shortness of breath, vomiting, diarrhea, or other symptoms.  Patient was Covid positive on 05/08/2020.  The history is provided by the patient.    Past Medical History:  Diagnosis Date   Allergy    pollen, trees, etc    Asthma    seasonal   COVID-19    05/08/20   History of colposcopy    CIN 3 HPV +   Tick bite of left hip    03/2020   UTI (urinary tract infection)     Patient Active Problem List   Diagnosis Date Noted   COVID-19 05/08/2020   Annual physical exam 04/14/2020   FH: heart attack 04/14/2020   History of abnormal cervical Pap smear 04/14/2020   Allergic rhinitis 04/14/2020   Acne vulgaris 10/29/2019   Pain in both wrists 10/29/2019   Overweight (BMI 25.0-29.9) 08/24/2019   Polyarthralgia 89/21/1940   Cyclic citrullinated peptide (CCP) antibody positive 08/24/2019   Confusion 08/24/2019   B12 deficiency 08/24/2019   CIN III (cervical intraepithelial neoplasia grade III) with severe dysplasia 02/27/2018   Asthma, well controlled 10/07/2015    Past Surgical History:  Procedure Laterality Date   HYSTEROSCOPY N/A 05/04/2018   Procedure: HYSTEROSCOPY;  Surgeon: Defrancesco, Alanda Slim, MD;  Location: [facility];  Service: Gynecology;  Laterality: N/A;   IUD REMOVAL N/A 05/04/2018   Procedure: INTRAUTERINE DEVICE (IUD) REMOVAL;  Surgeon: Brayton Mars, MD;  Location: [facility];  Service: Gynecology;  Laterality: N/A;   LEEP     for h/o abnormal pap   RETAINED PLACENTA REMOVAL     with vaccum suction     OB History    Gravida  2   Para  2    Term  2   Preterm  0   AB  0   Living  2     SAB  0   TAB  0   Ectopic  0   Multiple  0   Live Births  2            Home Medications    Prior to Admission medications   Medication Sig Start Date End Date Taking? Authorizing Provider  ACZONE 7.5 % GEL APPLY A SMALL AMOUNT TO AFFECTED AREA(S) ON THE SKIN EVERY DAY IN THE MORNING 08/12/19  Yes [provider]  albuterol (VENTOLIN HFA) 108 (90 Base) MCG/ACT inhaler Inhale 1-2 puffs into the lungs every 6 (six) hours as needed for wheezing or shortness of breath. 08/18/19  Yes McLean-Scocuzza, Nino Glow, MD  ALPRAZolam Duanne Moron) 0.25 MG tablet Take 1 tablet (0.25 mg total) by mouth daily as needed. 04/14/20  Yes McLean-Scocuzza, Nino Glow, MD  Cholecalciferol (VITAMIN D3 PO) Take by mouth.   Yes [provider]  clindamycin (CLINDAGEL) 1 % gel Apply topically 2 (two) times daily. 10/29/19  Yes McLean-Scocuzza, Nino Glow, MD  Cyanocobalamin (VITAMIN B12 PO) Take by mouth.   Yes [provider]  cyclobenzaprine (FLEXERIL) 5 MG tablet Take 1 tablet (5 mg total) by mouth 3 (three) times daily as  needed for muscle spasms. 03/24/19  Yes Shambley, Melody N, CNM  fluticasone (FLONASE) 50 MCG/ACT nasal spray Place 1 spray into both nostrils daily as needed for allergies or rhinitis.   Yes [provider]  levocetirizine (XYZAL) 5 MG tablet Take 1 tablet (5 mg total) by mouth every evening. 04/14/20  Yes McLean-Scocuzza, Nino Glow, MD  meloxicam (MOBIC) 7.5 MG tablet Take 7.5 mg by mouth daily.   Yes [provider]  montelukast (SINGULAIR) 10 MG tablet Take 1 tablet (10 mg total) by mouth at bedtime. 04/14/20  Yes McLean-Scocuzza, Nino Glow, MD  tretinoin (RETIN-A) 0.025 % cream Apply topically at bedtime. With moisturizer 10/29/19  Yes McLean-Scocuzza, Nino Glow, MD  phentermine (ADIPEX-P) 37.5 MG tablet Take 0.5-1 tablets (18.75-37.5 mg total) by mouth daily before breakfast. 12/03/19 02/22/20  McLean-Scocuzza, Nino Glow, MD    Family History Family History  Problem Relation Age of Onset   Heart disease Mother        MI died when pt was young; mom died when was 50 and mom was adopted    Diabetes Father    Depression Sister    Miscarriages / [place] Sister    Alcohol abuse Brother    Depression Brother    Drug abuse Brother     Social History Social History   Tobacco Use   Smoking status: Never Smoker   Smokeless tobacco: Never Used  Scientific laboratory technician Use: Never used  Substance Use Topics   Alcohol use: Yes    Alcohol/week: 2.0 standard drinks    Types: 2 Cans of beer per week   Drug use: No     Allergies   Apple and Doxycycline   Review of Systems Review of Systems  Constitutional: Negative for chills and fever.  HENT: Positive for ear pain. Negative for congestion and sore throat.   Eyes: Negative for pain and visual disturbance.  Respiratory: Negative for cough and shortness of breath.   Cardiovascular: Negative for chest pain and palpitations.  Gastrointestinal: Negative for abdominal pain, diarrhea and vomiting.  Genitourinary: Negative for dysuria and hematuria.  Musculoskeletal: Negative for arthralgias and back pain.  Skin: Negative for color change and rash.  Neurological: Negative for seizures and syncope.  All other systems reviewed and are negative.    Physical Exam Triage Vital Signs ED Triage Vitals  Enc Vitals Group     BP      Pulse      Resp      Temp      Temp src      SpO2      Weight      Height      Head Circumference      Peak Flow      Pain Score      Pain Loc      Pain Edu?      Excl. in [place]?    No data found.  Updated Vital Signs BP 106/65 (BP Location: Right Arm)    Pulse 94    Temp 98.7 F (37.1 C) (Oral)    Resp 18    Ht 5\' 6"  (1.676 m)    Wt 174 lb (78.9 kg)    LMP 06/18/2020    SpO2 98%    BMI 28.08 kg/m   Visual Acuity Right Eye Distance:   Left Eye Distance:   Bilateral Distance:    Right Eye Near:     Left Eye Near:    Bilateral  Near:     Physical Exam Vitals and nursing note reviewed.  Constitutional:      General: She is not in acute distress.    Appearance: She is well-developed. She is not ill-appearing.  HENT:     Head: Normocephalic and atraumatic.     Right Ear: Tympanic membrane and ear canal normal.     Left Ear: Tympanic membrane and ear canal normal.     Nose: Nose normal.     Mouth/Throat:     Mouth: Mucous membranes are moist.     Pharynx: Oropharynx is clear.  Eyes:     Conjunctiva/sclera: Conjunctivae normal.  Cardiovascular:     Rate and Rhythm: Normal rate and regular rhythm.     Heart sounds: No murmur heard.   Pulmonary:     Effort: Pulmonary effort is normal. No respiratory distress.     Breath sounds: Normal breath sounds.  Abdominal:     Palpations: Abdomen is soft.     Tenderness: There is no abdominal tenderness. There is no guarding.  Musculoskeletal:     Cervical back: Neck supple.  Skin:    General: Skin is warm and dry.     Findings: No rash.  Neurological:     General: No focal deficit present.     Mental Status: She is alert and oriented to person, place, and time.     Gait: Gait normal.  Psychiatric:        Mood and Affect: Mood normal.        Behavior: Behavior normal.      UC Treatments / Results  Labs (all labs ordered are listed, but only abnormal results are displayed) Labs Reviewed - No data to display  EKG   Radiology No results found.  Procedures Procedures (including critical care time)  Medications Ordered in UC Medications - No data to display  Initial Impression / Assessment and Plan / UC Course  I have reviewed the triage vital signs and the nursing notes.  Pertinent labs & imaging results that were available during my care of the patient were reviewed by me and considered in my medical decision making (see chart for details).   Right ear pain.  Cautioned patient not to take left over antibiotics.   Instructed her to take Tylenol or ibuprofen as needed for her discomfort.  Discussed her normal exam findings.  Instructed her to follow-up with her PCP if her symptoms or not improving.  Patient agrees to plan of care.   Final Clinical Impressions(s) / UC Diagnoses   Final diagnoses:  Right ear pain     Discharge Instructions     Take Tylenol or ibuprofen as needed for discomfort.    Follow up with your primary care provider if your symptoms are not improving.       ED Prescriptions    None     PDMP not reviewed this encounter.   Sharion Balloon, NP 06/25/20 [phone]

## 2020-06-25 NOTE — Discharge Instructions (Signed)
Take Tylenol or ibuprofen as needed for discomfort.    Follow up with your primary care provider if your symptoms are not improving.    

## 2020-06-25 NOTE — ED Triage Notes (Signed)
Patient states that she started having right ear pain with throbbing pain x 3 days. States that she had 4 doses of cefdinir that she took but is still having pain.

## 2020-06-27 ENCOUNTER — Other Ambulatory Visit: Payer: Self-pay

## 2020-06-27 DIAGNOSIS — L700 Acne vulgaris: Secondary | ICD-10-CM

## 2020-06-27 DIAGNOSIS — L709 Acne, unspecified: Secondary | ICD-10-CM

## 2020-06-27 MED ORDER — DAPSONE 7.5 % EX GEL: CUTANEOUS | 0 refills | Status: DC

## 2020-06-27 NOTE — Progress Notes (Signed)
Rx refill

## 2020-06-28 ENCOUNTER — Encounter (INDEPENDENT_AMBULATORY_CARE_PROVIDER_SITE_OTHER): Payer: BC Managed Care – PPO | Admitting: Internal Medicine

## 2020-06-28 DIAGNOSIS — A0472 Enterocolitis due to Clostridium difficile, not specified as recurrent: Secondary | ICD-10-CM

## 2020-06-30 NOTE — Addendum Note (Signed)
Addended by: Orland Mustard on: 06/30/2020 12:53 PM   Modules accepted: Orders

## 2020-07-03 ENCOUNTER — Telehealth (INDEPENDENT_AMBULATORY_CARE_PROVIDER_SITE_OTHER): Payer: BC Managed Care – PPO | Admitting: Gastroenterology

## 2020-07-03 DIAGNOSIS — B829 Intestinal parasitism, unspecified: Secondary | ICD-10-CM

## 2020-07-03 MED ORDER — ALBENDAZOLE 200 MG PO TABS
400.0000 mg | ORAL_TABLET | Freq: Once | ORAL | 0 refills | Status: AC
Start: 2020-07-03 — End: 2020-07-03

## 2020-07-03 NOTE — Progress Notes (Signed)
Carolyn Robles  [address]  [place]  [place], [place] [address]  Main: [phone]  Fax: [phone]   Gastroenterology Consultation  Referring Provider:     McLean-Scocuzza, Olivia Mackie * Primary Care Physician:  McLean-Scocuzza, Nino Glow, MD Reason for Consultation:     Parasites in stool         HPI:   Virtual Visit via Telephone Note  I connected with patient on 07/03/20 at 11:15 AM EDT by telephone and verified that I am speaking with the correct person using two identifiers.   I discussed the limitations, risks, security and privacy concerns of performing an evaluation and management service by telephone and the availability of in person appointments. I also discussed with the patient that there may be a patient responsible charge related to this service. The patient expressed understanding and agreed to proceed.  Location of the patient: Home Location of provider: Home Participating persons: Patient and provider only   History of Present Illness:  Stool parasites    Carolyn Robles is a 34 y.o. y/o female who used to work at our office as a Film/video editor.  She says that since she was diagnosed with cord recently her stools have not been normal have been very soft and mushy.  She actually called me up yesterday and was concerned about seeing worms in her stool and in fact sent me pictures of her stool which demonstrated a long worm evaluate in shape with appearance suggestive of ascariasis.  She has had some abdominal cramping.  She has probably seen one other 1.  No other family members have been affected.  Last CBC when checked was not anemic.     Past Medical History:  Diagnosis Date  . Allergy    pollen, trees, etc   . Asthma    seasonal  . COVID-19    05/08/20  . History of colposcopy    CIN 3 HPV +  . Tick bite of left hip    03/2020  . UTI (urinary tract infection)     Past Surgical History:  Procedure Laterality Date  . HYSTEROSCOPY N/A 05/04/2018     Procedure: HYSTEROSCOPY;  Surgeon: Defrancesco, Alanda Slim, MD;  Location: [facility];  Service: Gynecology;  Laterality: N/A;  . IUD REMOVAL N/A 05/04/2018   Procedure: INTRAUTERINE DEVICE (IUD) REMOVAL;  Surgeon: Brayton Mars, MD;  Location: [facility];  Service: Gynecology;  Laterality: N/A;  . LEEP     for h/o abnormal pap  . RETAINED PLACENTA REMOVAL     with vaccum suction     Prior to Admission medications   Medication Sig Start Date End Date Taking? Authorizing Provider  albuterol (VENTOLIN HFA) 108 (90 Base) MCG/ACT inhaler Inhale 1-2 puffs into the lungs every 6 (six) hours as needed for wheezing or shortness of breath. 08/18/19   McLean-Scocuzza, Nino Glow, MD  ALPRAZolam Duanne Moron) 0.25 MG tablet Take 1 tablet (0.25 mg total) by mouth daily as needed. 04/14/20   McLean-Scocuzza, Nino Glow, MD  Cholecalciferol (VITAMIN D3 PO) Take by mouth.    [provider]  clindamycin (CLINDAGEL) 1 % gel Apply topically 2 (two) times daily. 10/29/19   McLean-Scocuzza, Nino Glow, MD  Cyanocobalamin (VITAMIN B12 PO) Take by mouth.    [provider]  cyclobenzaprine (FLEXERIL) 5 MG tablet Take 1 tablet (5 mg total) by mouth 3 (three) times daily as needed for muscle spasms. 03/24/19   Shambley, Melody N, CNM  Dapsone (ACZONE) 7.5 %  GEL Apply a small amount to affected areas on the skin every day in the morning. 06/27/20   Moye, [place], MD  fluticasone (FLONASE) 50 MCG/ACT nasal spray Place 1 spray into both nostrils daily as needed for allergies or rhinitis.    [provider]  levocetirizine (XYZAL) 5 MG tablet Take 1 tablet (5 mg total) by mouth every evening. 04/14/20   McLean-Scocuzza, Nino Glow, MD  meloxicam (MOBIC) 7.5 MG tablet Take 7.5 mg by mouth daily.    [provider]  montelukast (SINGULAIR) 10 MG tablet Take 1 tablet (10 mg total) by mouth at bedtime. 04/14/20   McLean-Scocuzza, Nino Glow, MD  tretinoin (RETIN-A) 0.025 % cream Apply topically at bedtime. With  moisturizer 10/29/19   McLean-Scocuzza, Nino Glow, MD  phentermine (ADIPEX-P) 37.5 MG tablet Take 0.5-1 tablets (18.75-37.5 mg total) by mouth daily before breakfast. 12/03/19 02/22/20  McLean-Scocuzza, Nino Glow, MD    Family History  Problem Relation Age of Onset  . Heart disease Mother        MI died when pt was young; mom died when was 33 and mom was adopted   . Diabetes Father   . Depression Sister   . Miscarriages / Stillbirths Sister   . Alcohol abuse Brother   . Depression Brother   . Drug abuse Brother      Social History   Tobacco Use  . Smoking status: Never Smoker  . Smokeless tobacco: Never Used  Vaping Use  . Vaping Use: Never used  Substance Use Topics  . Alcohol use: Yes    Alcohol/week: 2.0 standard drinks    Types: 2 Cans of beer per week  . Drug use: No    Allergies as of 07/03/2020 - Review Complete 06/25/2020  Allergen Reaction Noted  . Apple  08/18/2019  . Doxycycline  10/29/2019    Review of Systems:    All systems reviewed and negative except where noted in HPI.   Observations/Objective:  Labs: CBC    Component Value Date/Time   WBC 6.0 04/18/2020 0926   WBC 6.6 08/06/2019 1320   RBC 4.46 04/18/2020 0926   RBC 4.44 08/06/2019 1320   HGB 12.8 04/18/2020 0926   HCT 37.1 04/18/2020 0926   PLT 251 04/18/2020 0926   MCV 83 04/18/2020 0926   MCV 83 11/12/2013 1927   MCH 28.7 04/18/2020 0926   MCH 27.7 08/06/2019 1320   MCHC 34.5 04/18/2020 0926   MCHC 33.2 08/06/2019 1320   RDW 13.3 04/18/2020 0926   RDW 14.0 11/12/2013 1927   LYMPHSABS 1.7 04/18/2020 0926   LYMPHSABS 1.5 09/22/2013 0202   MONOABS 0.3 05/04/2018 0621   MONOABS 0.7 09/22/2013 0202   EOSABS 0.2 04/18/2020 0926   EOSABS 0.1 09/22/2013 0202   BASOSABS 0.0 04/18/2020 0926   BASOSABS 0.0 09/22/2013 0202   CMP     Component Value Date/Time   NA 139 04/18/2020 0926   K 4.3 04/18/2020 0926   CL 104 04/18/2020 0926   CO2 22 04/18/2020 0926   GLUCOSE 100 (H) 04/18/2020 0926    GLUCOSE 103 (H) 08/06/2019 1320   BUN 13 04/18/2020 0926   CREATININE 0.69 04/18/2020 0926   CALCIUM 8.9 04/18/2020 0926   PROT 6.8 04/18/2020 0926   ALBUMIN 4.4 04/18/2020 0926   AST 16 04/18/2020 0926   ALT 14 04/18/2020 0926   ALKPHOS 45 (L) 04/18/2020 0926   BILITOT 0.4 04/18/2020 0926   GFRNONAA 115 04/18/2020 0926   GFRAA 132 04/18/2020  0926    Imaging Studies: No results found.  Assessment and Plan:   Carolyn Robles is a 34 y.o. y/o female set up an appointment for parasites seen in the stool.  In fact she sent me a picture of her bowel movement which clearly showed a long worm like parasite with appearance consistent with ascariasis.   Plan 1.  Suggested to provide stool sample to the lab once she has provided stool sample we will treat her with a single dose of albendazole. . Will check urine preg test before she takes the medicine.      I discussed the assessment and treatment plan with the patient. The patient was provided an opportunity to ask questions and all were answered. The patient agreed with the plan and demonstrated an understanding of the instructions.   The patient was advised to call back or seek an in-person evaluation if the symptoms worsen or if the condition fails to improve as anticipated.  I provided 15 minutes of non-face-to-face time during this encounter.   Dr Carolyn Bellows MD,MRCP [facility]) Gastroenterology/Hepatology Pager: [phone]   Speech recognition software was used to dictate the above note.

## 2020-07-04 LAB — PREGNANCY, URINE: Preg Test, Ur: NEGATIVE

## 2020-07-05 ENCOUNTER — Other Ambulatory Visit: Payer: Self-pay

## 2020-07-05 DIAGNOSIS — A0472 Enterocolitis due to Clostridium difficile, not specified as recurrent: Secondary | ICD-10-CM | POA: Insufficient documentation

## 2020-07-05 MED ORDER — DIFICID 200 MG PO TABS: 200.0000 mg | ORAL_TABLET | Freq: Two times a day (BID) | ORAL | 0 refills | Status: AC

## 2020-07-05 NOTE — Telephone Encounter (Signed)
Ive ordered your labs for [facility] for stool tests  GI pathogen panel Parasites  C difficile   Thank you    Okubo, Dakisha S  You 5 days ago   thank you I would like to use [facility] since I have them here at work if possible    Thressa Sheller, CMA routed conversation to You 5 days ago  Thressa Sheller, CMA  Beavin, Army Chaco S 5 days ago   Good morning,   No ma'am you will not need to be scheduled. Since you have already sent in a detailed message of what you need I have forwarded this to Dr Olivia Mackie McLean-Scocuzza. She will review this and your chart, then she will place lab orders.   Our office will reach out to you to get the labs scheduled once this is complete.    Mewborn, Dara Lords  You 5 days ago   Do I need to call the office to schedule a MyChart visit ? or what was the next step?   Thressa Sheller, CMA routed conversation to You 6 days ago  Grosvenor, Hedi S  You 6 days ago   yes  I agree   You  Sapp, Army Chaco S 6 days ago  TM No there is nothing you can take over the counter Since this is a new issue please schedule an appointment or we can address via a my chart fee  Are you agreeable?  [place]anco   Elpidio Galea T, CMA routed conversation to You 7 days ago  Wenz, Shaylee Stanislawski  You 7 days ago   Hey Dr. Aundra Dubin  I wanted to see if you could order Labs for me to check for parasites I have been eating a lot of sushie and raw salmon and my stomach is been very upset my stools look very diffent  had a almost white stool with hair looking things on it very strange have not had much apatite and my stomach hurts.  Is there an over the counter I can take just incase I have an infection?  Thank you  Tati    A/P  Stool studies + C diff  Pt had visit with GI Dr. Bailey Mech messaged him to treat and f/u with pt  Agreeable to my chart fee  Time 5-10 minutes   Dr. Olivia Mackie McLean-Scocuzza

## 2020-07-06 LAB — OVA AND PARASITE EXAMINATION

## 2020-07-06 LAB — CLOSTRIDIUM DIFFICILE BY PCR: Toxigenic C. Difficile by PCR: POSITIVE — AB

## 2020-07-06 NOTE — Progress Notes (Signed)
She has been prescribed dificid for 10 days and sent to pharmacy, patient is aware. Advised to return to work when stools are formed.

## 2020-10-11 ENCOUNTER — Telehealth: Payer: Self-pay

## 2020-10-11 DIAGNOSIS — J4520 Mild intermittent asthma, uncomplicated: Secondary | ICD-10-CM

## 2020-10-11 MED ORDER — ALBUTEROL SULFATE HFA 108 (90 BASE) MCG/ACT IN AERS
1.0000 | INHALATION_SPRAY | Freq: Four times a day (QID) | RESPIRATORY_TRACT | 12 refills | Status: DC | PRN
Start: 2020-10-11 — End: 2020-12-04

## 2020-10-11 NOTE — Telephone Encounter (Signed)
Pt requested refill on albuterol (VENTOLIN HFA) 108 (90 Base) MCG/ACT inhaler  To [facility]

## 2020-11-13 ENCOUNTER — Encounter: Payer: Self-pay | Admitting: Internal Medicine

## 2020-11-13 DIAGNOSIS — J309 Allergic rhinitis, unspecified: Secondary | ICD-10-CM

## 2020-11-13 MED ORDER — MONTELUKAST SODIUM 10 MG PO TABS: 10.0000 mg | ORAL_TABLET | Freq: Every day | ORAL | 1 refills | Status: DC

## 2020-12-04 ENCOUNTER — Other Ambulatory Visit: Payer: Self-pay | Admitting: Internal Medicine

## 2020-12-04 ENCOUNTER — Encounter: Payer: Self-pay | Admitting: Internal Medicine

## 2020-12-04 DIAGNOSIS — J4520 Mild intermittent asthma, uncomplicated: Secondary | ICD-10-CM

## 2020-12-04 DIAGNOSIS — J309 Allergic rhinitis, unspecified: Secondary | ICD-10-CM

## 2020-12-04 MED ORDER — ALBUTEROL SULFATE HFA 108 (90 BASE) MCG/ACT IN AERS: 1.0000 | INHALATION_SPRAY | Freq: Four times a day (QID) | RESPIRATORY_TRACT | 12 refills | Status: DC | PRN

## 2020-12-04 MED ORDER — LEVOCETIRIZINE DIHYDROCHLORIDE 5 MG PO TABS: 5.0000 mg | ORAL_TABLET | Freq: Every evening | ORAL | 3 refills | Status: DC

## 2021-01-05 ENCOUNTER — Encounter: Payer: Self-pay | Admitting: Internal Medicine

## 2021-01-08 ENCOUNTER — Other Ambulatory Visit: Payer: Self-pay | Admitting: Internal Medicine

## 2021-01-08 DIAGNOSIS — E663 Overweight: Secondary | ICD-10-CM

## 2021-01-08 MED ORDER — PHENTERMINE HCL 37.5 MG PO TABS: 18.2500 mg | ORAL_TABLET | Freq: Every day | ORAL | 0 refills | Status: DC

## 2021-02-20 ENCOUNTER — Telehealth: Payer: BC Managed Care – PPO | Admitting: Physician Assistant

## 2021-02-20 DIAGNOSIS — J019 Acute sinusitis, unspecified: Secondary | ICD-10-CM

## 2021-02-20 DIAGNOSIS — B9689 Other specified bacterial agents as the cause of diseases classified elsewhere: Secondary | ICD-10-CM

## 2021-02-20 MED ORDER — AMOXICILLIN-POT CLAVULANATE 875-125 MG PO TABS: 1.0000 | ORAL_TABLET | Freq: Two times a day (BID) | ORAL | 0 refills | Status: DC

## 2021-02-20 NOTE — Progress Notes (Signed)
I have spent 5 minutes in review of e-visit questionnaire, review and updating patient chart, medical decision making and response to patient.   Taronda Comacho Cody Myrtle Haller, PA-C    

## 2021-02-20 NOTE — Progress Notes (Signed)

## 2021-02-23 MED ORDER — FLUCONAZOLE 150 MG PO TABS: 150.0000 mg | ORAL_TABLET | Freq: Once | ORAL | 0 refills | Status: AC

## 2021-02-23 NOTE — Addendum Note (Signed)
Addended by: Montine Circle B on: 02/23/2021 09:10 AM   Modules accepted: Orders

## 2021-03-21 ENCOUNTER — Encounter: Payer: Self-pay | Admitting: Internal Medicine

## 2021-03-21 ENCOUNTER — Telehealth: Payer: Self-pay | Admitting: Internal Medicine

## 2021-03-21 NOTE — Telephone Encounter (Signed)
Patient is requesting a refill on her

## 2021-03-22 ENCOUNTER — Ambulatory Visit: Payer: BC Managed Care – PPO | Admitting: Adult Health

## 2021-03-22 NOTE — Telephone Encounter (Signed)
Called and scheduled Patient for 4:00pm today in office with Sharyn Lull.

## 2021-03-27 ENCOUNTER — Encounter: Payer: Self-pay | Admitting: Internal Medicine

## 2021-03-27 ENCOUNTER — Other Ambulatory Visit: Payer: Self-pay | Admitting: Internal Medicine

## 2021-03-27 ENCOUNTER — Telehealth: Payer: Self-pay | Admitting: Internal Medicine

## 2021-03-27 DIAGNOSIS — L700 Acne vulgaris: Secondary | ICD-10-CM

## 2021-03-27 MED ORDER — CLINDAMYCIN PHOSPHATE 1 % EX GEL: Freq: Two times a day (BID) | CUTANEOUS | 11 refills | Status: DC

## 2021-03-27 NOTE — Telephone Encounter (Signed)
Pt needs a refill on clindamycin (CLINDAGEL) 1 % gel [facility] [place]

## 2021-04-03 ENCOUNTER — Telehealth: Payer: BC Managed Care – PPO | Admitting: Physician Assistant

## 2021-04-03 DIAGNOSIS — B9689 Other specified bacterial agents as the cause of diseases classified elsewhere: Secondary | ICD-10-CM | POA: Diagnosis not present

## 2021-04-03 DIAGNOSIS — J019 Acute sinusitis, unspecified: Secondary | ICD-10-CM | POA: Diagnosis not present

## 2021-04-03 MED ORDER — FLUCONAZOLE 150 MG PO TABS: 150.0000 mg | ORAL_TABLET | Freq: Once | ORAL | 0 refills | Status: AC

## 2021-04-03 MED ORDER — LEVOFLOXACIN 500 MG PO TABS: 500.0000 mg | ORAL_TABLET | Freq: Every day | ORAL | 0 refills | Status: AC

## 2021-04-03 NOTE — Addendum Note (Signed)
Addended by: Brunetta Jeans on: 04/03/2021 07:21 AM   Modules accepted: Orders

## 2021-04-03 NOTE — Progress Notes (Signed)
I have spent 5 minutes in review of e-visit questionnaire, review and updating patient chart, medical decision making and response to patient.   Dylanie Quesenberry Cody Caryn Gienger, PA-C    

## 2021-04-03 NOTE — Progress Notes (Signed)
We are sorry that you are not feeling well.  Here is how we plan to help!  Based on what you have shared with me it looks like you have sinusitis.  Sinusitis is inflammation and infection in the sinus cavities of the head.  Based on your presentation I believe you most likely have Acute Bacterial Sinusitis.  This is an infection caused by bacteria and is treated with antibiotics. I have prescribed Levofloxicin 500mg  by mouth once daily for 7 days. You may use an oral decongestant such as Mucinex D or if you have glaucoma or high blood pressure use plain Mucinex. Saline nasal spray help and can safely be used as often as needed for congestion.  If you develop worsening sinus pain, fever or notice severe headache and vision changes, or if symptoms are not better after completion of antibiotic, please schedule an appointment with a health care provider.    Sinus infections are not as easily transmitted as other respiratory infection, however we still recommend that you avoid close contact with loved ones, especially the very young and elderly.  Remember to wash your hands thoroughly throughout the day as this is the number one way to prevent the spread of infection!  Home Care: Only take medications as instructed by your medical team. Complete the entire course of an antibiotic. Do not take these medications with alcohol. A steam or ultrasonic humidifier can help congestion.  You can place a towel over your head and breathe in the steam from hot water coming from a faucet. Avoid close contacts especially the very young and the elderly. Cover your mouth when you cough or sneeze. Always remember to wash your hands.  Get Help Right Away If: You develop worsening fever or sinus pain. You develop a severe head ache or visual changes. Your symptoms persist after you have completed your treatment plan.  Make sure you Understand these instructions. Will watch your condition. Will get help right away if  you are not doing well or get worse.  Your e-visit answers were reviewed by a board certified advanced clinical practitioner to complete your personal care plan.  Depending on the condition, your plan could have included both over the counter or prescription medications.  If there is a problem please reply  once you have received a response from your provider.  Your safety is important to [place].  If you have drug allergies check your prescription carefully.    You can use MyChart to ask questions about today's visit, request a non-urgent call back, or ask for a work or school excuse for 24 hours related to this e-Visit. If it has been greater than 24 hours you will need to follow up with your provider, or enter a new e-Visit to address those concerns.  You will get an e-mail in the next two days asking about your experience.  I hope that your e-visit has been valuable and will speed your recovery. Thank you for using e-visits.

## 2021-04-20 ENCOUNTER — Encounter: Payer: 59 | Admitting: Internal Medicine

## 2021-04-23 ENCOUNTER — Other Ambulatory Visit: Payer: Self-pay | Admitting: Internal Medicine

## 2021-04-23 DIAGNOSIS — J309 Allergic rhinitis, unspecified: Secondary | ICD-10-CM

## 2021-04-27 ENCOUNTER — Encounter: Payer: Self-pay | Admitting: Internal Medicine

## 2021-04-27 ENCOUNTER — Other Ambulatory Visit: Payer: Self-pay

## 2021-04-27 ENCOUNTER — Ambulatory Visit (INDEPENDENT_AMBULATORY_CARE_PROVIDER_SITE_OTHER): Payer: BC Managed Care – PPO | Admitting: Internal Medicine

## 2021-04-27 ENCOUNTER — Other Ambulatory Visit (HOSPITAL_COMMUNITY)
Admission: RE | Admit: 2021-04-27 | Discharge: 2021-04-27 | Disposition: A | Discharge status: Home / Self Care | Payer: BC Managed Care – PPO | Source: Ambulatory Visit | Attending: Internal Medicine | Admitting: Internal Medicine

## 2021-04-27 VITALS — BP 102/64 | HR 84 | Ht 66.0 in | Wt 179.8 lb

## 2021-04-27 DIAGNOSIS — N6452 Nipple discharge: Secondary | ICD-10-CM

## 2021-04-27 DIAGNOSIS — Z124 Encounter for screening for malignant neoplasm of cervix: Secondary | ICD-10-CM | POA: Diagnosis present

## 2021-04-27 DIAGNOSIS — R1084 Generalized abdominal pain: Secondary | ICD-10-CM

## 2021-04-27 DIAGNOSIS — Z1329 Encounter for screening for other suspected endocrine disorder: Secondary | ICD-10-CM

## 2021-04-27 DIAGNOSIS — Z0000 Encounter for general adult medical examination without abnormal findings: Secondary | ICD-10-CM

## 2021-04-27 DIAGNOSIS — J309 Allergic rhinitis, unspecified: Secondary | ICD-10-CM | POA: Diagnosis not present

## 2021-04-27 DIAGNOSIS — E663 Overweight: Secondary | ICD-10-CM

## 2021-04-27 DIAGNOSIS — N760 Acute vaginitis: Secondary | ICD-10-CM

## 2021-04-27 DIAGNOSIS — E538 Deficiency of other specified B group vitamins: Secondary | ICD-10-CM

## 2021-04-27 DIAGNOSIS — R41840 Attention and concentration deficit: Secondary | ICD-10-CM

## 2021-04-27 DIAGNOSIS — F419 Anxiety disorder, unspecified: Secondary | ICD-10-CM

## 2021-04-27 DIAGNOSIS — R195 Other fecal abnormalities: Secondary | ICD-10-CM

## 2021-04-27 DIAGNOSIS — Z1389 Encounter for screening for other disorder: Secondary | ICD-10-CM

## 2021-04-27 LAB — CBC WITH DIFFERENTIAL/PLATELET
Basophils Absolute: 0 10*3/uL (ref 0.0–0.1)
Basophils Relative: 0.2 % (ref 0.0–3.0)
Eosinophils Absolute: 0.2 10*3/uL (ref 0.0–0.7)
Eosinophils Relative: 2.5 % (ref 0.0–5.0)
HCT: 37.9 % (ref 36.0–46.0)
Hemoglobin: 13 g/dL (ref 12.0–15.0)
Lymphocytes Relative: 26.9 % (ref 12.0–46.0)
Lymphs Abs: 2.1 10*3/uL (ref 0.7–4.0)
MCHC: 34.2 g/dL (ref 30.0–36.0)
MCV: 83.9 fl (ref 78.0–100.0)
Monocytes Absolute: 0.5 10*3/uL (ref 0.1–1.0)
Monocytes Relative: 6.2 % (ref 3.0–12.0)
Neutro Abs: 4.9 10*3/uL (ref 1.4–7.7)
Neutrophils Relative %: 64.2 % (ref 43.0–77.0)
Platelets: 248 10*3/uL (ref 150.0–400.0)
RBC: 4.52 Mil/uL (ref 3.87–5.11)
RDW: 13.9 % (ref 11.5–15.5)
WBC: 7.6 10*3/uL (ref 4.0–10.5)

## 2021-04-27 LAB — LIPID PANEL
Cholesterol: 195 mg/dL (ref 0–200)
HDL: 49.6 mg/dL (ref >39.00)
LDL Cholesterol: 124 mg/dL — ABNORMAL HIGH (ref 0–99)
NonHDL: 145.43
Total CHOL/HDL Ratio: 4
Triglycerides: 105 mg/dL (ref 0.0–149.0)
VLDL: 21 mg/dL (ref 0.0–40.0)

## 2021-04-27 LAB — COMPREHENSIVE METABOLIC PANEL
ALT: 33 U/L (ref 0–35)
AST: 23 U/L (ref 0–37)
Albumin: 4.5 g/dL (ref 3.5–5.2)
Alkaline Phosphatase: 46 U/L (ref 39–117)
BUN: 13 mg/dL (ref 6–23)
CO2: 28 mEq/L (ref 19–32)
Calcium: 9 mg/dL (ref 8.4–10.5)
Chloride: 103 mEq/L (ref 96–112)
Creatinine, Ser: 0.78 mg/dL (ref 0.40–1.20)
GFR: 99.02 mL/min (ref >60.00)
Glucose, Bld: 80 mg/dL (ref 70–99)
Potassium: 4.3 mEq/L (ref 3.5–5.1)
Sodium: 138 mEq/L (ref 135–145)
Total Bilirubin: 0.8 mg/dL (ref 0.2–1.2)
Total Protein: 7.1 g/dL (ref 6.0–8.3)

## 2021-04-27 LAB — TSH: TSH: 2.23 u[IU]/mL (ref 0.35–5.50)

## 2021-04-27 LAB — VITAMIN B12: Vitamin B-12: >1550 pg/mL — ABNORMAL HIGH (ref 211–911)

## 2021-04-27 MED ORDER — MONTELUKAST SODIUM 10 MG PO TABS: ORAL_TABLET | ORAL | 3 refills | Status: DC

## 2021-04-27 MED ORDER — PHENTERMINE HCL 37.5 MG PO TABS: 18.2500 mg | ORAL_TABLET | Freq: Every day | ORAL | 0 refills | Status: DC

## 2021-04-27 MED ORDER — ALPRAZOLAM 0.25 MG PO TABS: 0.2500 mg | ORAL_TABLET | Freq: Every day | ORAL | 2 refills | Status: DC | PRN

## 2021-04-27 MED ORDER — CYANOCOBALAMIN 1000 MCG/ML IJ SOLN
1000.0000 ug | Freq: Once | INTRAMUSCULAR | Status: AC
  Administered 2021-04-27: 1000 ug via INTRAMUSCULAR

## 2021-04-27 NOTE — Progress Notes (Signed)
Pre visit review using our clinic review tool, if applicable. No additional management support is needed unless otherwise documented below in the visit note. 

## 2021-04-27 NOTE — Progress Notes (Signed)
Chief Complaint  Patient presents with   Annual Exam   Annual  1. C/o vaginal discharge green/yellow and irritation will do pap today s/p LEEP and colp in the past she is married currently not having sex  2. C/o reduced focus will refer to test add/adhd and h/o anxiety h/o concentration issues as as child  3. Overweight 18.25 adipex wants refill    Review of Systems  Constitutional:  Negative for weight loss.  HENT:  Negative for hearing loss.   Eyes:  Negative for blurred vision.  Respiratory:  Negative for shortness of breath.   Cardiovascular:  Negative for chest pain.  Gastrointestinal:  Negative for abdominal pain.  Genitourinary:        Discharge  Musculoskeletal:  Negative for falls and joint pain.  Skin:  Negative for rash.  Neurological:  Negative for headaches.  Psychiatric/Behavioral:  Negative for depression. The patient is nervous/anxious.   Past Medical History:  Diagnosis Date   Allergy    pollen, trees, etc    Asthma    seasonal   COVID-19    05/08/20 trouble taste and smell   History of colposcopy    CIN 3 HPV +   Tick bite of left hip    03/2020   UTI (urinary tract infection)    Past Surgical History:  Procedure Laterality Date   COLPOSCOPY     from 10 oclock to 2 oclock   HYSTEROSCOPY N/A 05/04/2018   Procedure: HYSTEROSCOPY;  Surgeon: Defrancesco, Alanda Slim, MD;  Location: [facility];  Service: Gynecology;  Laterality: N/A;   IUD REMOVAL N/A 05/04/2018   Procedure: INTRAUTERINE DEVICE (IUD) REMOVAL;  Surgeon: Brayton Mars, MD;  Location: [facility];  Service: Gynecology;  Laterality: N/A;   LEEP     for h/o abnormal pap   RETAINED PLACENTA REMOVAL     with vaccum suction    Family History  Problem Relation Age of Onset   Heart disease Mother        MI died when pt was young; mom died when was 17 and mom was adopted    Diabetes Father    Depression Sister    Miscarriages / [place] Sister    Alcohol abuse Brother    Depression Brother     Drug abuse Brother    Social History   Socioeconomic History   Marital status: Married    Spouse name: Not on file   Number of children: Not on file   Years of education: Not on file   Highest education level: Not on file  Occupational History   Not on file  Tobacco Use   Smoking status: Never   Smokeless tobacco: Never  Vaping Use   Vaping Use: Never used  Substance and Sexual Activity   Alcohol use: Yes    Alcohol/week: 2.0 standard drinks    Types: 2 Cans of beer per week   Drug use: No   Sexual activity: Yes    Birth control/protection: None  Other Topics Concern   Not on file  Social History Narrative   College ed    Receptionist at [employer]    Married    2 kids    Grew up in [place] with siblings   Social Determinants of Radio broadcast assistant Strain: Not on file  Food Insecurity: Not on file  Transportation Needs: Not on file  Physical Activity: Not on file  Stress: Not on file  Social Connections: Not on file  Intimate  Partner Violence: Not on file   Current Meds  Medication Sig   albuterol (VENTOLIN HFA) 108 (90 Base) MCG/ACT inhaler INHALE 1 TO 2 PUFFS INTO THE LUNGS EVERY 6 (SIX) HOURS AS NEEDED FOR WHEEZING OR SHORTNESS OF BREATH.   Cholecalciferol (VITAMIN D3 PO) Take by mouth.   clindamycin (CLINDAGEL) 1 % gel Apply topically 2 (two) times daily.   Cyanocobalamin (VITAMIN B12 PO) Take by mouth.   cyclobenzaprine (FLEXERIL) 5 MG tablet Take 1 tablet (5 mg total) by mouth 3 (three) times daily as needed for muscle spasms.   Dapsone (ACZONE) 7.5 % GEL Apply a small amount to affected areas on the skin every day in the morning.   fluticasone (FLONASE) 50 MCG/ACT nasal spray Place 1 spray into both nostrils daily as needed for allergies or rhinitis.   levocetirizine (XYZAL) 5 MG tablet TAKE 1 TABLET BY MOUTH EVERY EVENING   meloxicam (MOBIC) 7.5 MG tablet Take 7.5 mg by mouth daily.   tretinoin (RETIN-A) 0.025 % cream Apply topically at bedtime.  With moisturizer   [DISCONTINUED] ALPRAZolam (XANAX) 0.25 MG tablet Take 1 tablet (0.25 mg total) by mouth daily as needed.   [DISCONTINUED] montelukast (SINGULAIR) 10 MG tablet TAKE 1 TABLET BY MOUTH EVERYDAY AT BEDTIME   [DISCONTINUED] phentermine (ADIPEX-P) 37.5 MG tablet TAKE 0.5-1 TABLETS (18.75-37.5 MG TOTAL) BY MOUTH DAILY BEFORE BREAKFAST.   Allergies  Allergen Reactions   Apple     Or fresh fruit throat itching    Doxycycline    No results found for this or any previous visit (from the past 2160 hour(s)). Objective  Body mass index is 29.02 kg/m. Wt Readings from Last 3 Encounters:  04/27/21 179 lb 12.8 oz (81.6 kg)  06/25/20 174 lb (78.9 kg)  05/08/20 175 lb (79.4 kg)   Temp Readings from Last 3 Encounters:  06/25/20 98.7 F (37.1 C) (Oral)  05/08/20 98.9 F (37.2 C) (Oral)  04/14/20 98.6 F (37 C) (Oral)   BP Readings from Last 3 Encounters:  04/27/21 102/64  06/25/20 106/65  05/08/20 (!) 104/86   Pulse Readings from Last 3 Encounters:  04/27/21 84  06/25/20 94  05/08/20 74    Physical Exam Vitals and nursing note reviewed.  Constitutional:      Appearance: Normal appearance. She is well-developed and well-groomed.  HENT:     Head: Normocephalic and atraumatic.  Eyes:     Conjunctiva/sclera: Conjunctivae normal.     Pupils: Pupils are equal, round, and reactive to light.  Cardiovascular:     Rate and Rhythm: Normal rate and regular rhythm.     Heart sounds: Normal heart sounds. No murmur heard. Pulmonary:     Effort: Pulmonary effort is normal.     Breath sounds: Normal breath sounds.  Chest:     Chest wall: No mass.  Breasts:    Breasts are symmetrical.     Right: Normal. No axillary adenopathy.     Left: Normal. No axillary adenopathy.  Abdominal:     Tenderness: There is no abdominal tenderness.  Lymphadenopathy:     Upper Body:     Right upper body: No axillary adenopathy.     Left upper body: No axillary adenopathy.  Skin:     General: Skin is warm and dry.  Neurological:     General: No focal deficit present.     Mental Status: She is alert and oriented to person, place, and time. Mental status is at baseline.     Gait: Gait  normal.  Psychiatric:        Attention and Perception: Attention and perception normal.        Mood and Affect: Mood and affect normal.        Speech: Speech normal.        Behavior: Behavior normal. Behavior is cooperative.        Thought Content: Thought content normal.        Cognition and Memory: Cognition and memory normal.        Judgment: Judgment normal.    Assessment  Plan  Annual physical exam -  Overweight (BMI 25.0-29.9) - Plan: phentermine (ADIPEX-P) 18.25 qd 4 months on and 3 months off rec healthy diet and exercise Flu shot had in late 06/2019 Tdap in 2015/6 per pt check with [facility] 07/20/2019  Consider HPV vaccine pap 03/24/2019 neg neg HPV + with CIN 3 h/o colp and LEEP Dr. Marcelline Mates had pap 03/2019 neg pap neg HPV but rec yearly paps x 3 years and then if normal can do paps Q3 years Screening mammo age 52 baseline then age 22 [place] consider in future Colonoscopy age 16 screening covid vaccine not had yet consider but she declines   Eye exam last 2019 [facility] Dr. Glennon Mac   Allergic rhinitis, unspecified seasonality, unspecified trigger - Plan: montelukast (SINGULAIR) 10 MG tablet  Anxiety.conc issue - Plan: ALPRAZolam (XANAX) 0.25 MG tablet, Ambulatory referral to Psychology add/adhd testing  Breast discharge - Plan: Prolactin  Routine cervical smear - Plan: Cytology - PAP( [place]) Acute vaginitis - Plan: Cervicovaginal ancillary only( [place]), Cytology - PAP( [place])  B12 def  B12 shot today and does otc b12 normally  Provider: Dr. Olivia Mackie McLean-Scocuzza-Internal Medicine

## 2021-04-27 NOTE — Addendum Note (Signed)
Addended by: Thressa Sheller on: 04/27/2021 02:01 PM   Modules accepted: Orders

## 2021-04-27 NOTE — Patient Instructions (Addendum)
Consider testing ADD/ADHD [facility] specialist  [place] [phone]    [facility] [place] [phone]   [facility] [place] [address] [place] [address] [phone]

## 2021-04-28 ENCOUNTER — Encounter: Payer: Self-pay | Admitting: Internal Medicine

## 2021-04-28 LAB — URINALYSIS, ROUTINE W REFLEX MICROSCOPIC
Bacteria, UA: NONE SEEN /HPF
Bilirubin Urine: NEGATIVE
Glucose, UA: NEGATIVE
Hgb urine dipstick: NEGATIVE
Hyaline Cast: NONE SEEN /LPF
Ketones, ur: NEGATIVE
Nitrite: NEGATIVE
Protein, ur: NEGATIVE
Specific Gravity, Urine: 1.03 (ref 1.001–1.035)
pH: 5.5 (ref 5.0–8.0)

## 2021-04-28 LAB — MICROSCOPIC MESSAGE

## 2021-04-28 LAB — PROLACTIN: Prolactin: 7.7 ng/mL

## 2021-04-30 ENCOUNTER — Other Ambulatory Visit: Payer: Self-pay | Admitting: Internal Medicine

## 2021-04-30 DIAGNOSIS — B3731 Acute candidiasis of vulva and vagina: Secondary | ICD-10-CM

## 2021-04-30 DIAGNOSIS — B373 Candidiasis of vulva and vagina: Secondary | ICD-10-CM

## 2021-04-30 LAB — CERVICOVAGINAL ANCILLARY ONLY
Bacterial Vaginitis (gardnerella): NEGATIVE
Candida Glabrata: NEGATIVE
Candida Vaginitis: POSITIVE — AB
Chlamydia: NEGATIVE
Comment: NEGATIVE
Comment: NEGATIVE
Comment: NEGATIVE
Comment: NEGATIVE
Comment: NEGATIVE
Comment: NORMAL
Neisseria Gonorrhea: NEGATIVE
Trichomonas: NEGATIVE

## 2021-04-30 MED ORDER — FLUCONAZOLE 150 MG PO TABS
150.0000 mg | ORAL_TABLET | Freq: Once | ORAL | 0 refills | Status: AC
Start: 2021-04-30 — End: 2021-04-30

## 2021-05-03 LAB — CYTOLOGY - PAP
Comment: NEGATIVE
Diagnosis: NEGATIVE
High risk HPV: NEGATIVE

## 2021-06-21 ENCOUNTER — Telehealth: Payer: BC Managed Care – PPO | Admitting: Physician Assistant

## 2021-06-21 DIAGNOSIS — B9689 Other specified bacterial agents as the cause of diseases classified elsewhere: Secondary | ICD-10-CM

## 2021-06-21 DIAGNOSIS — J019 Acute sinusitis, unspecified: Secondary | ICD-10-CM | POA: Diagnosis not present

## 2021-06-21 MED ORDER — FLUCONAZOLE 150 MG PO TABS: 150.0000 mg | ORAL_TABLET | Freq: Once | ORAL | 0 refills | Status: AC

## 2021-06-21 MED ORDER — AMOXICILLIN-POT CLAVULANATE 875-125 MG PO TABS: 1.0000 | ORAL_TABLET | Freq: Two times a day (BID) | ORAL | 0 refills | Status: DC

## 2021-06-21 NOTE — Progress Notes (Signed)
E-Visit for Sinus Problems  We are sorry that you are not feeling well.  Here is how we plan to help!  Based on what you have shared with me it looks like you have sinusitis.  Sinusitis is inflammation and infection in the sinus cavities of the head.  Based on your presentation I believe you most likely have Acute Bacterial Sinusitis.  This is an infection caused by bacteria and is treated with antibiotics. I have prescribed Augmentin '875mg'$ /'125mg'$  one tablet twice daily with food, for 7 days. -- this has much broader sinus coverage than plain Amoxicillin and is the best option giving your allergy to Doxycycline You may use an oral decongestant such as Mucinex D or if you have glaucoma or high blood pressure use plain Mucinex. Saline nasal spray help and can safely be used as often as needed for congestion.  If you develop worsening sinus pain, fever or notice severe headache and vision changes, or if symptoms are not better after completion of antibiotic, please schedule an appointment with a health care provider.    I will send in a Diflucan in case of antibiotic-associated yeast. Take a daily probiotic to help prevent this.   Sinus infections are not as easily transmitted as other respiratory infection, however we still recommend that you avoid close contact with loved ones, especially the very young and elderly.  Remember to wash your hands thoroughly throughout the day as this is the number one way to prevent the spread of infection!  Home Care: Only take medications as instructed by your medical team. Complete the entire course of an antibiotic. Do not take these medications with alcohol. A steam or ultrasonic humidifier can help congestion.  You can place a towel over your head and breathe in the steam from hot water coming from a faucet. Avoid close contacts especially the very young and the elderly. Cover your mouth when you cough or sneeze. Always remember to wash your hands.  Get Help  Right Away If: You develop worsening fever or sinus pain. You develop a severe head ache or visual changes. Your symptoms persist after you have completed your treatment plan.  Make sure you Understand these instructions. Will watch your condition. Will get help right away if you are not doing well or get worse.  Thank you for choosing an e-visit.  Your e-visit answers were reviewed by a board certified advanced clinical practitioner to complete your personal care plan. Depending upon the condition, your plan could have included both over the counter or prescription medications.  Please review your pharmacy choice. Make sure the pharmacy is open so you can pick up prescription now. If there is a problem, you may contact your provider through [employer] and have the prescription routed to another pharmacy.  Your safety is important to [place]. If you have drug allergies check your prescription carefully.   For the next 24 hours you can use MyChart to ask questions about today's visit, request a non-urgent call back, or ask for a work or school excuse. You will get an email in the next two days asking about your experience. I hope that your e-visit has been valuable and will speed your recovery.

## 2021-06-21 NOTE — Progress Notes (Signed)
I have spent 5 minutes in review of e-visit questionnaire, review and updating patient chart, medical decision making and response to patient.   Daniesha Driver Cody Lavayah Vita, PA-C    

## 2021-08-18 ENCOUNTER — Telehealth: Payer: BC Managed Care – PPO | Admitting: Nurse Practitioner

## 2021-08-18 DIAGNOSIS — J019 Acute sinusitis, unspecified: Secondary | ICD-10-CM

## 2021-08-18 DIAGNOSIS — B9689 Other specified bacterial agents as the cause of diseases classified elsewhere: Secondary | ICD-10-CM | POA: Diagnosis not present

## 2021-08-18 MED ORDER — AMOXICILLIN-POT CLAVULANATE 875-125 MG PO TABS: 1.0000 | ORAL_TABLET | Freq: Two times a day (BID) | ORAL | 0 refills | Status: DC

## 2021-08-18 NOTE — Progress Notes (Signed)

## 2021-08-29 ENCOUNTER — Other Ambulatory Visit: Payer: Self-pay

## 2021-08-29 ENCOUNTER — Telehealth: Payer: Self-pay | Admitting: Internal Medicine

## 2021-08-29 DIAGNOSIS — L700 Acne vulgaris: Secondary | ICD-10-CM

## 2021-08-29 MED ORDER — CLINDAMYCIN PHOSPHATE 1 % EX GEL: Freq: Two times a day (BID) | CUTANEOUS | 11 refills | Status: DC

## 2021-08-29 NOTE — Telephone Encounter (Signed)
Refilled. Pharmacy should notify the Patient when ready

## 2021-08-29 NOTE — Telephone Encounter (Signed)
Pt called in regards to medication refill for clindamycin. Pt states pharmacy submitted a request for refill a week ago and have not received anything back. Pt uses [facility] in [place]

## 2021-09-24 ENCOUNTER — Other Ambulatory Visit: Payer: Self-pay | Admitting: Internal Medicine

## 2021-09-24 DIAGNOSIS — E663 Overweight: Secondary | ICD-10-CM

## 2021-09-26 ENCOUNTER — Other Ambulatory Visit: Payer: Self-pay | Admitting: Internal Medicine

## 2021-09-26 DIAGNOSIS — J309 Allergic rhinitis, unspecified: Secondary | ICD-10-CM

## 2021-09-27 ENCOUNTER — Other Ambulatory Visit: Payer: Self-pay | Admitting: Internal Medicine

## 2021-09-27 ENCOUNTER — Telehealth: Payer: Self-pay | Admitting: Internal Medicine

## 2021-09-27 DIAGNOSIS — J309 Allergic rhinitis, unspecified: Secondary | ICD-10-CM

## 2021-09-27 NOTE — Telephone Encounter (Signed)
Patient is requesting the following refills; levocetirizine (XYZAL) 5 MG tablet, phentermine (ADIPEX-P) 37.5 MG tablet, and clindamycin (CLINDAGEL) 1 % gel.

## 2021-10-02 NOTE — Telephone Encounter (Signed)
Medications were sent in 09/27/21

## 2021-10-04 ENCOUNTER — Telehealth: Payer: BC Managed Care – PPO | Admitting: Physician Assistant

## 2021-10-04 DIAGNOSIS — B9689 Other specified bacterial agents as the cause of diseases classified elsewhere: Secondary | ICD-10-CM | POA: Diagnosis not present

## 2021-10-04 DIAGNOSIS — J028 Acute pharyngitis due to other specified organisms: Secondary | ICD-10-CM

## 2021-10-04 MED ORDER — AMOXICILLIN 500 MG PO CAPS: 500.0000 mg | ORAL_CAPSULE | Freq: Two times a day (BID) | ORAL | 0 refills | Status: AC

## 2021-10-04 NOTE — Progress Notes (Signed)
E-Visit for Sore Throat - Strep Symptoms  We are sorry that you are not feeling well.  Here is how we plan to help!  Based on what you have shared with me it is likely that you have bacterial pharyngitis.  Bacterial pharyngitis is inflammation and infection in the back of the throat.  This is an infection cause by bacteria and is treated with antibiotics.  I have prescribed Amoxicillin 500 mg twice a day for 10 days. For throat pain, we recommend over the counter oral pain relief medications such as acetaminophen or aspirin, or anti-inflammatory medications such as ibuprofen or naproxen sodium. Topical treatments such as oral throat lozenges or sprays may be used as needed. Bacterial infections are not as easily transmitted as other respiratory infections, however we still recommend that you avoid close contact with loved ones, especially the very young and elderly.  Remember to wash your hands thoroughly throughout the day as this is the number one way to prevent the spread of infection and wipe down door knobs and counters with disinfectant.   Home Care: Only take medications as instructed by your medical team. Complete the entire course of an antibiotic. Do not take these medications with alcohol. A steam or ultrasonic humidifier can help congestion.  You can place a towel over your head and breathe in the steam from hot water coming from a faucet. Avoid close contacts especially the very young and the elderly. Cover your mouth when you cough or sneeze. Always remember to wash your hands.  Get Help Right Away If: You develop worsening fever or sinus pain. You develop a severe head ache or visual changes. Your symptoms persist after you have completed your treatment plan.  Make sure you Understand these instructions. Will watch your condition. Will get help right away if you are not doing well or get worse.   Thank you for choosing an e-visit.  Your e-visit answers were reviewed by a  board certified advanced clinical practitioner to complete your personal care plan. Depending upon the condition, your plan could have included both over the counter or prescription medications.  Please review your pharmacy choice. Make sure the pharmacy is open so you can pick up prescription now. If there is a problem, you may contact your provider through [employer] and have the prescription routed to another pharmacy.  Your safety is important to [place]. If you have drug allergies check your prescription carefully.   For the next 24 hours you can use MyChart to ask questions about today's visit, request a non-urgent call back, or ask for a work or school excuse. You will get an email in the next two days asking about your experience. I hope that your e-visit has been valuable and will speed your recovery.  I provided 5 minutes of non face-to-face time during this encounter for chart review and documentation.

## 2021-11-26 ENCOUNTER — Encounter: Payer: Self-pay | Admitting: Internal Medicine

## 2021-11-26 NOTE — Telephone Encounter (Signed)
For your information  

## 2021-11-26 NOTE — Telephone Encounter (Signed)
Does Patient need an appointment to fill out form?

## 2021-12-03 ENCOUNTER — Telehealth: Payer: Self-pay | Admitting: Internal Medicine

## 2021-12-03 NOTE — Telephone Encounter (Signed)
Called and scheduled Patient's husband an appointment

## 2021-12-03 NOTE — Telephone Encounter (Signed)
Ok to accept pts husband as NP

## 2021-12-05 ENCOUNTER — Telehealth: Payer: Self-pay | Admitting: Internal Medicine

## 2021-12-05 NOTE — Telephone Encounter (Signed)
Patient had request for medical consult forms dropped off . Forms are up front in Dr Audrie Gallus color folder.

## 2021-12-07 NOTE — Telephone Encounter (Signed)
Completed as much as I could for PCP left for approval.

## 2021-12-12 ENCOUNTER — Encounter: Payer: Self-pay | Admitting: Internal Medicine

## 2021-12-12 DIAGNOSIS — Z0279 Encounter for issue of other medical certificate: Secondary | ICD-10-CM

## 2021-12-13 NOTE — Telephone Encounter (Signed)
Forms given to you by Juliann Pulse on 12/07/21. Has this been completed? Patient contacting office to check on status of paperwork.  ?

## 2021-12-18 NOTE — Telephone Encounter (Signed)
Patient's husband came in office today to pick up forms  ?

## 2021-12-24 ENCOUNTER — Other Ambulatory Visit: Payer: Self-pay | Admitting: Internal Medicine

## 2021-12-24 DIAGNOSIS — E663 Overweight: Secondary | ICD-10-CM

## 2021-12-24 MED ORDER — PHENTERMINE HCL 37.5 MG PO TABS: 18.2500 mg | ORAL_TABLET | Freq: Every day | ORAL | 0 refills | Status: DC

## 2022-02-27 ENCOUNTER — Other Ambulatory Visit: Payer: Self-pay | Admitting: Internal Medicine

## 2022-02-27 DIAGNOSIS — F419 Anxiety disorder, unspecified: Secondary | ICD-10-CM

## 2022-02-27 NOTE — Telephone Encounter (Signed)
Refilled: 04/27/2021 ?Last OV: 04/27/2021 ?Next OV: 04/30/2022 ?

## 2022-04-18 ENCOUNTER — Encounter: Payer: Self-pay | Admitting: Internal Medicine

## 2022-04-18 ENCOUNTER — Telehealth: Payer: Self-pay

## 2022-04-18 ENCOUNTER — Other Ambulatory Visit: Payer: Self-pay

## 2022-04-18 DIAGNOSIS — B829 Intestinal parasitism, unspecified: Secondary | ICD-10-CM

## 2022-04-18 NOTE — Addendum Note (Signed)
Addended by: Orland Mustard on: 04/18/2022 01:08 PM   Modules accepted: Orders

## 2022-04-18 NOTE — Telephone Encounter (Signed)
Ok, lets test stool for ova and parasites and after will do visit

## 2022-04-18 NOTE — Telephone Encounter (Signed)
Patient contacted office to be advised in regards to right lower abdominal pain, and changes in stool. Pain has been present a little over a week. She thought it was associated with being constipated.   Her stools are hard at present time.  Last bm this morning.  She also stated that the stool looks very similar to the parasite occurrence she was treated for 07/03/20.  I advised that you may recommend an office visit prior to having labs.  She said she can do a mychart visit.  Please advise.  Thanks, [place], [place]

## 2022-04-18 NOTE — Telephone Encounter (Signed)
Patient has been advised that Dr. Vicente Males would like to check stool for O and P.  Lab order placed.  Pt will have her husband to pick up lab material.  Requisition provided.  Office visit to be scheduled after results received.  Thanks, [place], [place]

## 2022-04-23 ENCOUNTER — Other Ambulatory Visit: Payer: Self-pay | Admitting: Internal Medicine

## 2022-04-23 DIAGNOSIS — J309 Allergic rhinitis, unspecified: Secondary | ICD-10-CM

## 2022-04-26 ENCOUNTER — Other Ambulatory Visit: Payer: Self-pay | Admitting: Internal Medicine

## 2022-04-26 DIAGNOSIS — J309 Allergic rhinitis, unspecified: Secondary | ICD-10-CM

## 2022-04-26 LAB — OVA AND PARASITE EXAMINATION

## 2022-04-29 ENCOUNTER — Telehealth: Payer: Self-pay

## 2022-04-29 NOTE — Telephone Encounter (Signed)
Called and left a message for call back sent mychart message also

## 2022-04-29 NOTE — Telephone Encounter (Signed)
Patient verbalized understanding and is not having the pain anymore. Stopped taking phentermine and the pain went away

## 2022-04-29 NOTE — Progress Notes (Signed)
Inform normal stool test - ask how she is doing and we can do office or video visit if she wishes

## 2022-04-29 NOTE — Telephone Encounter (Signed)
Patient left a voicemail on my phone and returned call and left a message for call back

## 2022-04-29 NOTE — Telephone Encounter (Signed)
-----   Message from Jonathon Bellows, MD sent at 04/29/2022  8:58 AM EDT ----- Inform normal stool test - ask how she is doing and we can do office or video visit if she wishes

## 2022-04-30 ENCOUNTER — Ambulatory Visit (INDEPENDENT_AMBULATORY_CARE_PROVIDER_SITE_OTHER): Payer: BC Managed Care – PPO | Admitting: Internal Medicine

## 2022-04-30 ENCOUNTER — Encounter: Payer: Self-pay | Admitting: Internal Medicine

## 2022-04-30 VITALS — BP 110/60 | HR 68 | Temp 98.0°F | Resp 14 | Ht 66.0 in | Wt 181.4 lb

## 2022-04-30 DIAGNOSIS — Z1329 Encounter for screening for other suspected endocrine disorder: Secondary | ICD-10-CM

## 2022-04-30 DIAGNOSIS — Z1389 Encounter for screening for other disorder: Secondary | ICD-10-CM

## 2022-04-30 DIAGNOSIS — E559 Vitamin D deficiency, unspecified: Secondary | ICD-10-CM | POA: Diagnosis not present

## 2022-04-30 DIAGNOSIS — Z13220 Encounter for screening for lipoid disorders: Secondary | ICD-10-CM

## 2022-04-30 DIAGNOSIS — Z0000 Encounter for general adult medical examination without abnormal findings: Secondary | ICD-10-CM | POA: Diagnosis not present

## 2022-04-30 DIAGNOSIS — J4520 Mild intermittent asthma, uncomplicated: Secondary | ICD-10-CM

## 2022-04-30 DIAGNOSIS — F419 Anxiety disorder, unspecified: Secondary | ICD-10-CM

## 2022-04-30 DIAGNOSIS — J309 Allergic rhinitis, unspecified: Secondary | ICD-10-CM

## 2022-04-30 DIAGNOSIS — F39 Unspecified mood [affective] disorder: Secondary | ICD-10-CM | POA: Insufficient documentation

## 2022-04-30 DIAGNOSIS — L700 Acne vulgaris: Secondary | ICD-10-CM

## 2022-04-30 DIAGNOSIS — Z1231 Encounter for screening mammogram for malignant neoplasm of breast: Secondary | ICD-10-CM

## 2022-04-30 HISTORY — DX: Anxiety disorder, unspecified: F41.9

## 2022-04-30 LAB — LIPID PANEL
Cholesterol: 183 mg/dL (ref 0–200)
HDL: 44.9 mg/dL (ref >39.00)
LDL Cholesterol: 114 mg/dL — ABNORMAL HIGH (ref 0–99)
NonHDL: 137.98
Total CHOL/HDL Ratio: 4
Triglycerides: 119 mg/dL (ref 0.0–149.0)
VLDL: 23.8 mg/dL (ref 0.0–40.0)

## 2022-04-30 LAB — URINALYSIS, ROUTINE W REFLEX MICROSCOPIC
Bilirubin Urine: NEGATIVE
Hgb urine dipstick: NEGATIVE
Ketones, ur: NEGATIVE
Leukocytes,Ua: NEGATIVE
Nitrite: NEGATIVE
Specific Gravity, Urine: >=1.03 — AB (ref 1.000–1.030)
Total Protein, Urine: NEGATIVE
Urine Glucose: NEGATIVE
Urobilinogen, UA: 0.2 (ref 0.0–1.0)
pH: 5.5 (ref 5.0–8.0)

## 2022-04-30 LAB — COMPREHENSIVE METABOLIC PANEL
ALT: 17 U/L (ref 0–35)
AST: 19 U/L (ref 0–37)
Albumin: 4.5 g/dL (ref 3.5–5.2)
Alkaline Phosphatase: 50 U/L (ref 39–117)
BUN: 10 mg/dL (ref 6–23)
CO2: 28 mEq/L (ref 19–32)
Calcium: 9.1 mg/dL (ref 8.4–10.5)
Chloride: 104 mEq/L (ref 96–112)
Creatinine, Ser: 0.69 mg/dL (ref 0.40–1.20)
GFR: 112.34 mL/min (ref >60.00)
Glucose, Bld: 98 mg/dL (ref 70–99)
Potassium: 4.5 mEq/L (ref 3.5–5.1)
Sodium: 139 mEq/L (ref 135–145)
Total Bilirubin: 0.6 mg/dL (ref 0.2–1.2)
Total Protein: 6.9 g/dL (ref 6.0–8.3)

## 2022-04-30 LAB — CBC WITH DIFFERENTIAL/PLATELET
Basophils Absolute: 0 10*3/uL (ref 0.0–0.1)
Basophils Relative: 0.3 % (ref 0.0–3.0)
Eosinophils Absolute: 0.3 10*3/uL (ref 0.0–0.7)
Eosinophils Relative: 4.4 % (ref 0.0–5.0)
HCT: 38.5 % (ref 36.0–46.0)
Hemoglobin: 13.2 g/dL (ref 12.0–15.0)
Lymphocytes Relative: 27.5 % (ref 12.0–46.0)
Lymphs Abs: 1.6 10*3/uL (ref 0.7–4.0)
MCHC: 34.2 g/dL (ref 30.0–36.0)
MCV: 85.5 fl (ref 78.0–100.0)
Monocytes Absolute: 0.4 10*3/uL (ref 0.1–1.0)
Monocytes Relative: 6.3 % (ref 3.0–12.0)
Neutro Abs: 3.6 10*3/uL (ref 1.4–7.7)
Neutrophils Relative %: 61.5 % (ref 43.0–77.0)
Platelets: 266 10*3/uL (ref 150.0–400.0)
RBC: 4.5 Mil/uL (ref 3.87–5.11)
RDW: 13.6 % (ref 11.5–15.5)
WBC: 5.9 10*3/uL (ref 4.0–10.5)

## 2022-04-30 LAB — VITAMIN D 25 HYDROXY (VIT D DEFICIENCY, FRACTURES): VITD: 60.34 ng/mL (ref 30.00–100.00)

## 2022-04-30 LAB — TSH: TSH: 1.74 u[IU]/mL (ref 0.35–5.50)

## 2022-04-30 MED ORDER — LEVOCETIRIZINE DIHYDROCHLORIDE 5 MG PO TABS
5.0000 mg | ORAL_TABLET | Freq: Every day | ORAL | 3 refills | Status: DC
Start: 2022-04-30 — End: 2023-07-03

## 2022-04-30 MED ORDER — TRETINOIN 0.025 % EX CREA: TOPICAL_CREAM | Freq: Every day | CUTANEOUS | 11 refills | Status: AC

## 2022-04-30 MED ORDER — CLINDAMYCIN PHOSPHATE 1 % EX GEL: Freq: Two times a day (BID) | CUTANEOUS | 11 refills | Status: DC

## 2022-04-30 MED ORDER — CYCLOBENZAPRINE HCL 5 MG PO TABS: 5.0000 mg | ORAL_TABLET | Freq: Three times a day (TID) | ORAL | 2 refills | Status: DC | PRN

## 2022-04-30 MED ORDER — DAPSONE 7.5 % EX GEL: CUTANEOUS | 0 refills | Status: DC

## 2022-04-30 MED ORDER — MONTELUKAST SODIUM 10 MG PO TABS: ORAL_TABLET | ORAL | 3 refills | Status: DC

## 2022-04-30 MED ORDER — ALPRAZOLAM 0.25 MG PO TABS: 0.2500 mg | ORAL_TABLET | Freq: Every day | ORAL | 2 refills | Status: DC | PRN

## 2022-04-30 MED ORDER — MELOXICAM 7.5 MG PO TABS: 7.5000 mg | ORAL_TABLET | Freq: Every day | ORAL | 0 refills | Status: DC | PRN

## 2022-04-30 MED ORDER — FLUTICASONE PROPIONATE 50 MCG/ACT NA SUSP: 1.0000 | Freq: Every day | NASAL | 11 refills | Status: DC | PRN

## 2022-04-30 MED ORDER — ALBUTEROL SULFATE HFA 108 (90 BASE) MCG/ACT IN AERS: INHALATION_SPRAY | RESPIRATORY_TRACT | 12 refills | Status: AC

## 2022-04-30 NOTE — Progress Notes (Signed)
Chief Complaint  Patient presents with   Annual Exam    Fasting this morning, denies any pain.   Annual  1. Labs today 2. H/o constipation/diarrhea and bloating GI w/u negative parasites disc avoid dairy which she does     Review of Systems  Constitutional:  Negative for weight loss.  HENT:  Negative for hearing loss.   Eyes:  Negative for blurred vision.  Respiratory:  Negative for shortness of breath.   Cardiovascular:  Negative for chest pain.  Gastrointestinal:  Positive for constipation. Negative for abdominal pain and blood in stool.  Genitourinary:  Negative for dysuria.  Musculoskeletal:  Negative for falls and joint pain.  Skin:  Negative for rash.  Neurological:  Negative for headaches.  Psychiatric/Behavioral:  Negative for depression.    Past Medical History:  Diagnosis Date   Allergy    pollen, trees, etc    Asthma    seasonal   C. difficile diarrhea    COVID-19    05/08/20 trouble taste and smell   History of colposcopy    CIN 3 HPV +   Tick bite of left hip    03/2020   UTI (urinary tract infection)    Past Surgical History:  Procedure Laterality Date   COLPOSCOPY     from 10 oclock to 2 oclock   HYSTEROSCOPY N/A 05/04/2018   Procedure: HYSTEROSCOPY;  Surgeon: Defrancesco, Alanda Slim, MD;  Location: [facility] OR[facility];  Service: Gynecology;  Laterality: N/A;   IUD REMOVAL N/A 05/04/2018   Procedure: INTRAUTERINE DEVICE (IUD) REMOVAL;  Surgeon: Brayton Mars, MD;  Location: [facility] OR[facility];  Service: Gynecology;  Laterality: N/A;   LEEP     for h/o abnormal pap   RETAINED PLACENTA REMOVAL     with vaccum suction    Family History  Problem Relation Age of Onset   Heart disease Mother        MI died when pt was young; mom died when was 7 and mom was adopted    Diabetes Father    Depression Sister    Miscarriages / [place] Sister    Alcohol abuse Brother    Depression Brother    Drug abuse Brother    Social History   Socioeconomic History   Marital  status: Married    Spouse name: Not on file   Number of children: Not on file   Years of education: Not on file   Highest education level: Not on file  Occupational History   Not on file  Tobacco Use   Smoking status: Never   Smokeless tobacco: Never  Vaping Use   Vaping Use: Never used  Substance and Sexual Activity   Alcohol use: Yes    Alcohol/week: 2.0 standard drinks of alcohol    Types: 2 Cans of beer per week   Drug use: No   Sexual activity: Yes    Birth control/protection: None  Other Topics Concern   Not on file  Social History Narrative   College ed    Receptionist at [employer]    Married    2 kids    Grew up in [place] with siblings   Social Determinants of Radio broadcast assistant Strain: Not on file  Food Insecurity: Not on file  Transportation Needs: Not on file  Physical Activity: Not on file  Stress: Not on file  Social Connections: Not on file  Intimate Partner Violence: Not on file   Current Meds  Medication Sig   Cholecalciferol (  VITAMIN D3 PO) Take by mouth.   Cyanocobalamin (VITAMIN B12 PO) Take by mouth.   phentermine (ADIPEX-P) 37.5 MG tablet Take 0.5 tablets (18.75 mg total) by mouth daily before breakfast.   [DISCONTINUED] albuterol (VENTOLIN HFA) 108 (90 Base) MCG/ACT inhaler INHALE 1 TO 2 PUFFS INTO THE LUNGS EVERY 6 (SIX) HOURS AS NEEDED FOR WHEEZING OR SHORTNESS OF BREATH.   [DISCONTINUED] ALPRAZolam (XANAX) 0.25 MG tablet TAKE 1 TABLET BY MOUTH EVERY DAY AS NEEDED   [DISCONTINUED] clindamycin (CLINDAGEL) 1 % gel Apply topically 2 (two) times daily.   [DISCONTINUED] cyclobenzaprine (FLEXERIL) 5 MG tablet Take 1 tablet (5 mg total) by mouth 3 (three) times daily as needed for muscle spasms.   [DISCONTINUED] Dapsone (ACZONE) 7.5 % GEL Apply a small amount to affected areas on the skin every day in the morning.   [DISCONTINUED] fluticasone (FLONASE) 50 MCG/ACT nasal spray Place 1 spray into both nostrils daily as needed for allergies or  rhinitis.   [DISCONTINUED] levocetirizine (XYZAL) 5 MG tablet TAKE 1 TABLET BY MOUTH EVERY DAY   [DISCONTINUED] meloxicam (MOBIC) 7.5 MG tablet Take 7.5 mg by mouth daily.   [DISCONTINUED] montelukast (SINGULAIR) 10 MG tablet TAKE 1 TABLET BY MOUTH EVERYDAY AT BEDTIME   [DISCONTINUED] tretinoin (RETIN-A) 0.025 % cream Apply topically at bedtime. With moisturizer   Allergies  Allergen Reactions   Apple Juice     Or fresh fruit throat itching    Doxycycline    Recent Results (from the past 2160 hour(s))  Ova and parasite examination     Status: None   Collection Time: 04/19/22  8:13 AM   Specimen: Stool   ST  Result Value Ref Range   OVA + PARASITE EXAM Final report     Comment: These results were obtained using wet preparation(s) and trichrome stained smear. This test does not include testing for Cryptosporidium parvum, Cyclospora, or Microsporidia.    O&P result 1 Comment     Comment: No ova, cysts, or parasites seen. One negative specimen does not rule out the possibility of a parasitic infection.    Objective  Body mass index is 29.28 kg/m. Wt Readings from Last 3 Encounters:  04/30/22 181 lb 6.4 oz (82.3 kg)  04/27/21 179 lb 12.8 oz (81.6 kg)  06/25/20 174 lb (78.9 kg)   Temp Readings from Last 3 Encounters:  04/30/22 98 F (36.7 C) (Oral)  06/25/20 98.7 F (37.1 C) (Oral)  05/08/20 98.9 F (37.2 C) (Oral)   BP Readings from Last 3 Encounters:  04/30/22 110/60  04/27/21 102/64  06/25/20 106/65   Pulse Readings from Last 3 Encounters:  04/30/22 68  04/27/21 84  06/25/20 94    Physical Exam Vitals and nursing note reviewed.  Constitutional:      Appearance: Normal appearance. She is well-developed and well-groomed.  HENT:     Head: Normocephalic and atraumatic.  Eyes:     Conjunctiva/sclera: Conjunctivae normal.     Pupils: Pupils are equal, round, and reactive to light.  Cardiovascular:     Rate and Rhythm: Normal rate and regular rhythm.      Heart sounds: Normal heart sounds. No murmur heard. Pulmonary:     Effort: Pulmonary effort is normal.     Breath sounds: Normal breath sounds.  Abdominal:     General: Abdomen is flat. Bowel sounds are normal.     Tenderness: There is no abdominal tenderness.  Musculoskeletal:        General: No tenderness.  Skin:  General: Skin is warm and dry.  Neurological:     General: No focal deficit present.     Mental Status: She is alert and oriented to person, place, and time. Mental status is at baseline.     Cranial Nerves: Cranial nerves 2-12 are intact.     Motor: Motor function is intact.     Coordination: Coordination is intact.     Gait: Gait is intact.  Psychiatric:        Attention and Perception: Attention and perception normal.        Mood and Affect: Mood and affect normal.        Speech: Speech normal.        Behavior: Behavior normal. Behavior is cooperative.        Thought Content: Thought content normal.        Cognition and Memory: Cognition and memory normal.        Judgment: Judgment normal.     Assessment  Plan  Annual physical exam - Plan: Comprehensive metabolic panel, Lipid panel, CBC with Differential/Platelet, TSH, Urinalysis, Routine w reflex microscopic  Vitamin D deficiency - Plan: Vitamin D (25 hydroxy)  Lipid screening - Plan: Lipid panel  Thyroid disorder screening - Plan: TSH  Screening for blood or protein in urine - Plan: Urinalysis, Routine w reflex microscopic  Screening mammogram, encounter for - Plan: MM 3D SCREEN BREAST BILATERAL  Asthma, mild intermittent, well-controlled - Plan: albuterol (VENTOLIN HFA) 108 (90 Base) MCG/ACT inhaler  Anxiety - Plan: ALPRAZolam (XANAX) 0.25 MG tablet  Acne vulgaris - Plan: clindamycin (CLINDAGEL) 1 % gel, Dapsone (ACZONE) 7.5 % GEL, tretinoin (RETIN-A) 0.025 % cream  Allergic rhinitis, unspecified seasonality, unspecified trigger - Plan: levocetirizine (XYZAL) 5 MG tablet, montelukast (SINGULAIR) 10  MG tablet   HM Flu shot had in late 06/2019 declines  Tdap in 2016 per pt check with [facility] due in 2026 LMP 07/20/2019  Consider HPV vaccine pap 03/24/2019 neg neg HPV + with CIN 3 h/o colp and LEEP Dr. Marcelline Mates had pap 03/2019 neg pap neg HPV but rec yearly paps x 3 years neg (2020, 2021 and 2022) and then if normal can do paps Q3 years per ob/gyn will have pcp or ob/gyn   Screening mammo age 11 baseline then age 24 [place] consider in future  Colonoscopy age 59 screening covid vaccine not had yet consider but she declines   Eye exam last 2019 [facility] Dr. Glennon Mac   Rec healthy diet and exercise  Hep C testing declines  Provider: Dr. Olivia Mackie McLean-Scocuzza-[facility]

## 2022-04-30 NOTE — Patient Instructions (Addendum)
[facility] as given the info before for both  [facility] [place]  [place] [phone]    *[facility] [place]  [address] [place] [address]  [phone]   [facility] [place] [address]  [phone]

## 2022-05-01 NOTE — Telephone Encounter (Signed)
Patient is requesting a refill on Xyzal  Last fill date:  04/08/2021  Last OV:  04/27/2021  Future OV;  04/30/2022  Dazani Norby,cma

## 2022-05-09 ENCOUNTER — Telehealth: Payer: BC Managed Care – PPO | Admitting: Physician Assistant

## 2022-05-09 DIAGNOSIS — J019 Acute sinusitis, unspecified: Secondary | ICD-10-CM | POA: Diagnosis not present

## 2022-05-09 DIAGNOSIS — B9689 Other specified bacterial agents as the cause of diseases classified elsewhere: Secondary | ICD-10-CM | POA: Diagnosis not present

## 2022-05-09 MED ORDER — CEFDINIR 300 MG PO CAPS: 300.0000 mg | ORAL_CAPSULE | Freq: Two times a day (BID) | ORAL | 0 refills | Status: DC

## 2022-05-09 NOTE — Progress Notes (Signed)
E-Visit for Sinus Problems  We are sorry that you are not feeling well.  Here is how we plan to help!  Based on what you have shared with me it looks like you have sinusitis.  Sinusitis is inflammation and infection in the sinus cavities of the head.  Based on your presentation I believe you most likely have Acute Bacterial Sinusitis.  This is an infection caused by bacteria and is treated with antibiotics. I have prescribed Cefdinir 300 mg twice daily for 10 days since Amoxicillin /Augmentin has not worked well for you in the past and you are allergic to Doxycycline.  You may use an oral decongestant such as Mucinex D or if you have glaucoma or high blood pressure use plain Mucinex. Saline nasal spray help and can safely be used as often as needed for congestion.  If you develop worsening sinus pain, fever or notice severe headache and vision changes, or if symptoms are not better after completion of antibiotic, please schedule an appointment with a health care provider.    Sinus infections are not as easily transmitted as other respiratory infection, however we still recommend that you avoid close contact with loved ones, especially the very young and elderly.  Remember to wash your hands thoroughly throughout the day as this is the number one way to prevent the spread of infection!  Home Care: Only take medications as instructed by your medical team. Complete the entire course of an antibiotic. Do not take these medications with alcohol. A steam or ultrasonic humidifier can help congestion.  You can place a towel over your head and breathe in the steam from hot water coming from a faucet. Avoid close contacts especially the very young and the elderly. Cover your mouth when you cough or sneeze. Always remember to wash your hands.  Get Help Right Away If: You develop worsening fever or sinus pain. You develop a severe head ache or visual changes. Your symptoms persist after you have completed  your treatment plan.  Make sure you Understand these instructions. Will watch your condition. Will get help right away if you are not doing well or get worse.  Thank you for choosing an e-visit.  Your e-visit answers were reviewed by a board certified advanced clinical practitioner to complete your personal care plan. Depending upon the condition, your plan could have included both over the counter or prescription medications.  Please review your pharmacy choice. Make sure the pharmacy is open so you can pick up prescription now. If there is a problem, you may contact your provider through [employer] and have the prescription routed to another pharmacy.  Your safety is important to [place]. If you have drug allergies check your prescription carefully.   For the next 24 hours you can use MyChart to ask questions about today's visit, request a non-urgent call back, or ask for a work or school excuse. You will get an email in the next two days asking about your experience. I hope that your e-visit has been valuable and will speed your recovery.

## 2022-05-09 NOTE — Progress Notes (Signed)
I have spent 5 minutes in review of e-visit questionnaire, review and updating patient chart, medical decision making and response to patient.   Takirah Binford Cody Ernst [place], PA-C    

## 2022-05-28 ENCOUNTER — Ambulatory Visit
Admission: RE | Admit: 2022-05-28 | Discharge: 2022-05-28 | Disposition: A | Discharge status: Home / Self Care | Payer: BC Managed Care – PPO | Source: Ambulatory Visit | Attending: Internal Medicine | Admitting: Internal Medicine

## 2022-05-28 DIAGNOSIS — Z1231 Encounter for screening mammogram for malignant neoplasm of breast: Secondary | ICD-10-CM | POA: Diagnosis present

## 2022-06-21 ENCOUNTER — Other Ambulatory Visit: Payer: Self-pay | Admitting: Internal Medicine

## 2022-06-21 ENCOUNTER — Encounter: Payer: Self-pay | Admitting: Internal Medicine

## 2022-06-21 DIAGNOSIS — E663 Overweight: Secondary | ICD-10-CM

## 2022-06-24 ENCOUNTER — Other Ambulatory Visit: Payer: Self-pay | Admitting: Internal Medicine

## 2022-06-24 DIAGNOSIS — E663 Overweight: Secondary | ICD-10-CM

## 2022-06-24 MED ORDER — PHENTERMINE HCL 37.5 MG PO TABS: 18.2500 mg | ORAL_TABLET | Freq: Every day | ORAL | 0 refills | Status: DC

## 2022-08-06 ENCOUNTER — Ambulatory Visit (INDEPENDENT_AMBULATORY_CARE_PROVIDER_SITE_OTHER): Payer: BC Managed Care – PPO | Admitting: Family Medicine

## 2022-08-06 ENCOUNTER — Encounter: Payer: Self-pay | Admitting: Family Medicine

## 2022-08-06 VITALS — BP 118/80 | HR 66 | Temp 98.4°F | Ht 66.0 in | Wt 185.0 lb

## 2022-08-06 DIAGNOSIS — J309 Allergic rhinitis, unspecified: Secondary | ICD-10-CM

## 2022-08-06 DIAGNOSIS — F419 Anxiety disorder, unspecified: Secondary | ICD-10-CM | POA: Diagnosis not present

## 2022-08-06 DIAGNOSIS — H6991 Unspecified Eustachian tube disorder, right ear: Secondary | ICD-10-CM

## 2022-08-06 DIAGNOSIS — L301 Dyshidrosis [pompholyx]: Secondary | ICD-10-CM

## 2022-08-06 DIAGNOSIS — J4520 Mild intermittent asthma, uncomplicated: Secondary | ICD-10-CM | POA: Diagnosis not present

## 2022-08-06 MED ORDER — FLUTICASONE PROPIONATE 50 MCG/ACT NA SUSP: 2.0000 | Freq: Every day | NASAL | 11 refills | Status: DC | PRN

## 2022-08-06 MED ORDER — CLOBETASOL PROPIONATE 0.05 % EX OINT: 1.0000 | TOPICAL_OINTMENT | Freq: Two times a day (BID) | CUTANEOUS | 1 refills | Status: AC

## 2022-08-06 NOTE — Assessment & Plan Note (Signed)
Reports using Albuterol prn for seasonal allergies Continue Albuterol as needed for SOB or wheezing q6h

## 2022-08-06 NOTE — Assessment & Plan Note (Signed)
Well controlled.  Has not had to use Xanax for anxiety.  -Offered SSRI, patient politely declined at this time -Given not taking regularly, no concern for withdrawal, will discontinue Xanax. Discussed with patient and is agreeable given increasing risks of medication. -Recommend stress management therapy -Follow up in 2-3 months -Strict return precautions provided

## 2022-08-06 NOTE — Assessment & Plan Note (Addendum)
Uses Flonase daily. Reviewed how to administer medication properly.  Refill Flonase 2 sprays daily to each nostril

## 2022-08-06 NOTE — Patient Instructions (Signed)
It was a pleasure meeting you today. Thank you for allowing me to take part in your health care.  Our goals for today as we discussed include:  For your ear fullness Do not think there is an infection, if you develop pain, hearing loss, ringing in the ear, fevers please notify MD Continue Xyzal 5 mg daily Continue Flonase 2 sprays once daily   For your weight management Will review your last labs.  Unfortunately the low dose Wegovy and Kirke Shaggy is on back order from [facility] [facility].  If you meet criteria I can place an order to see if it's available. Recommend to journal for 24-48 hours food and liquid intake.  We can review this at next visit. Recommend resistance training in combination with cardio  For your eczema Start Clobetasol 0.05% ointment twice daily as needed.  Use sparingly.   Recommend using emollient daily   Please follow-up with PCP in 2-3 months  If you have any questions or concerns, please do not hesitate to call the office at [phone].  I look forward to our next visit and until then take care and stay safe.  Regards,   Carollee Leitz, MD   [employer]   Calorie Counting for [employer] Calories are units of energy. Your body needs a certain number of calories from food to keep going throughout the day. When you eat or drink more calories than your body needs, your body stores the extra calories mostly as fat. When you eat or drink fewer calories than your body needs, your body burns fat to get the energy it needs. Calorie counting means keeping track of how many calories you eat and drink each day. Calorie counting can be helpful if you need to lose weight. If you eat fewer calories than your body needs, you should lose weight. Ask your health care provider what a healthy weight is for you. For calorie counting to work, you will need to eat the right number of calories each day to lose a healthy amount of weight per week. A dietitian  can help you figure out how many calories you need in a day and will suggest ways to reach your calorie goal. A healthy amount of weight to lose each week is usually 1-2 lb (0.5-0.9 kg). This usually means that your daily calorie intake should be reduced by 500-750 calories. Eating 1,200-1,500 calories a day can help most women lose weight. Eating 1,500-1,800 calories a day can help most men lose weight. What do I need to know about calorie counting? Work with your health care provider or dietitian to determine how many calories you should get each day. To meet your daily calorie goal, you will need to: Find out how many calories are in each food that you would like to eat. Try to do this before you eat. Decide how much of the food you plan to eat. Keep a food log. Do this by writing down what you ate and how many calories it had. To successfully lose weight, it is important to balance calorie counting with a healthy lifestyle that includes regular activity. Where do I find calorie information?  The number of calories in a food can be found on a Nutrition Facts label. If a food does not have a Nutrition Facts label, try to look up the calories online or ask your dietitian for help. Remember that calories are listed per serving. If you choose to have more than one serving of a  food, you will have to multiply the calories per serving by the number of servings you plan to eat. For example, the label on a package of bread might say that a serving size is 1 slice and that there are 90 calories in a serving. If you eat 1 slice, you will have eaten 90 calories. If you eat 2 slices, you will have eaten 180 calories. How do I keep a food log? After each time that you eat, record the following in your food log as soon as possible: What you ate. Be sure to include toppings, sauces, and other extras on the food. How much you ate. This can be measured in cups, ounces, or number of items. How many calories were  in each food and drink. The total number of calories in the food you ate. Keep your food log near you, such as in a pocket-sized notebook or on an app or website on your mobile phone. Some programs will calculate calories for you and show you how many calories you have left to meet your daily goal. What are some portion-control tips? Know how many calories are in a serving. This will help you know how many servings you can have of a certain food. Use a measuring cup to measure serving sizes. You could also try weighing out portions on a kitchen scale. With time, you will be able to estimate serving sizes for some foods. Take time to put servings of different foods on your favorite plates or in your favorite bowls and cups so you know what a serving looks like. Try not to eat straight from a food's packaging, such as from a bag or box. Eating straight from the package makes it hard to see how much you are eating and can lead to overeating. Put the amount you would like to eat in a cup or on a plate to make sure you are eating the right portion. Use smaller plates, glasses, and bowls for smaller portions and to prevent overeating. Try not to multitask. For example, avoid watching TV or using your computer while eating. If it is time to eat, sit down at a table and enjoy your food. This will help you recognize when you are full. It will also help you be more mindful of what and how much you are eating. What are tips for following this plan? Reading food labels Check the calorie count compared with the serving size. The serving size may be smaller than what you are used to eating. Check the source of the calories. Try to choose foods that are high in protein, fiber, and vitamins, and low in saturated fat, trans fat, and sodium. Shopping Read nutrition labels while you shop. This will help you make healthy decisions about which foods to buy. Pay attention to nutrition labels for low-fat or fat-free foods.  These foods sometimes have the same number of calories or more calories than the full-fat versions. They also often have added sugar, starch, or salt to make up for flavor that was removed with the fat. Make a grocery list of lower-calorie foods and stick to it. Cooking Try to cook your favorite foods in a healthier way. For example, try baking instead of frying. Use low-fat dairy products. Meal planning Use more fruits and vegetables. One-half of your plate should be fruits and vegetables. Include lean proteins, such as chicken, [place], and fish. Lifestyle Each week, aim to do one of the following: 150 minutes of moderate exercise, such as  walking. 75 minutes of vigorous exercise, such as running. General information Know how many calories are in the foods you eat most often. This will help you calculate calorie counts faster. Find a way of tracking calories that works for you. Get creative. Try different apps or programs if writing down calories does not work for you. What foods should I eat?  Eat nutritious foods. It is better to have a nutritious, high-calorie food, such as an avocado, than a food with few nutrients, such as a bag of potato chips. Use your calories on foods and drinks that will fill you up and will not leave you hungry soon after eating. Examples of foods that fill you up are nuts and nut butters, vegetables, lean proteins, and high-fiber foods such as whole grains. High-fiber foods are foods with more than 5 g of fiber per serving. Pay attention to calories in drinks. Low-calorie drinks include water and unsweetened drinks. The items listed above may not be a complete list of foods and beverages you can eat. Contact a dietitian for more information. What foods should I limit? Limit foods or drinks that are not good sources of vitamins, minerals, or protein or that are high in unhealthy fats. These include: Candy. Other sweets. Sodas, specialty coffee drinks, alcohol,  and juice. The items listed above may not be a complete list of foods and beverages you should avoid. Contact a dietitian for more information. How do I count calories when eating out? Pay attention to portions. Often, portions are much larger when eating out. Try these tips to keep portions smaller: Consider sharing a meal instead of getting your own. If you get your own meal, eat only half of it. Before you start eating, ask for a container and put half of your meal into it. When available, consider ordering smaller portions from the menu instead of full portions. Pay attention to your food and drink choices. Knowing the way food is cooked and what is included with the meal can help you eat fewer calories. If calories are listed on the menu, choose the lower-calorie options. Choose dishes that include vegetables, fruits, whole grains, low-fat dairy products, and lean proteins. Choose items that are boiled, broiled, grilled, or steamed. Avoid items that are buttered, battered, fried, or served with cream sauce. Items labeled as crispy are usually fried, unless stated otherwise. Choose water, low-fat milk, unsweetened iced tea, or other drinks without added sugar. If you want an alcoholic beverage, choose a lower-calorie option, such as a glass of wine or light beer. Ask for dressings, sauces, and syrups on the side. These are usually high in calories, so you should limit the amount you eat. If you want a salad, choose a garden salad and ask for grilled meats. Avoid extra toppings such as bacon, cheese, or fried items. Ask for the dressing on the side, or ask for olive oil and vinegar or lemon to use as dressing. Estimate how many servings of a food you are given. Knowing serving sizes will help you be aware of how much food you are eating at restaurants. Where to find more information Centers for Disease Control and Prevention: http://www.wolf.info/ U.S. Department of Agriculture:  http://www.wilson-mendoza.org/ Summary Calorie counting means keeping track of how many calories you eat and drink each day. If you eat fewer calories than your body needs, you should lose weight. A healthy amount of weight to lose per week is usually 1-2 lb (0.5-0.9 kg). This usually means reducing your daily calorie  intake by 500-750 calories. The number of calories in a food can be found on a Nutrition Facts label. If a food does not have a Nutrition Facts label, try to look up the calories online or ask your dietitian for help. Use smaller plates, glasses, and bowls for smaller portions and to prevent overeating. Use your calories on foods and drinks that will fill you up and not leave you hungry shortly after a meal. This information is not intended to replace advice given to you by your health care provider. Make sure you discuss any questions you have with your health care provider. Document Revised: 11/11/2019 Document Reviewed: 11/11/2019 Elsevier Patient Education  [place].

## 2022-08-06 NOTE — Progress Notes (Unsigned)
    SUBJECTIVE:   CHIEF COMPLAINT / HPI: transfer of care  Patient presents to clinic to transfer care  Right ear fullness Reports chronic right ear infections and sinusitis.  Endorses right ear fullness for 2 days.  Denies any fevers, ear discharge, pain, nasal congestion, headaches or sick contacts. Denies any decrease in hearing, tinnitus, dizziness or gait disturbance.  Has been on Antibiotics in the past for recurrent ear infection.  Seen by ENT 1-2 years ago for similar symptoms.    Weight management Prescribed Phentermine. Does not think has been helping for weight loss.  Has noticed increase in energy with half tablet daily.  Does not like effect of full tablet.  Eats 2 meals day.  Walks for exercise.  Has not modified lifestyle to allow for weight loss.  Reports that insurance will cover injectable medications.  Would like to loose 20 lbs.  Goal 160-165 lbs.  Anxiety Prescribed Xanax.  Has not had to use to help with anxiety.  Reports using 1 tablet last month to help with sleep.  Glass of wine nightly.  Works from home.  Likes things in order.  Sometimes gets overwhelmed when having a lot to accomplish at one time. Able to use breathing techniques to get back to normal.  Denies any SI/HI  PERTINENT  PMH / PSH:    OBJECTIVE:   BP 118/80 (BP Location: Left Arm, Patient Position: Sitting, Cuff Size: Normal)   Pulse 66   Temp 98.4 F (36.9 C) (Oral)   Ht '5\' 6"'$  (1.676 m)   Wt 185 lb (83.9 kg)   LMP 07/09/2022   SpO2 99%   BMI 29.86 kg/m    General: Alert, no acute distress Cardio: Normal S1 and S2, RRR, no r/m/g Pulm: CTAB, normal work of breathing Abdomen: Bowel sounds normal. Abdomen soft and non-tender.  Extremities: No peripheral edema.  Neuro: Cranial nerves grossly intact   ASSESSMENT/PLAN:   Anxiety Well controlled.  Has not had to use Xanax for anxiety.  -Offered SSRI, patient politely declined at this time -Given not taking regularly, no concern for  withdrawal, will discontinue Xanax. Discussed with patient and is agreeable given increasing risks of medication. -Recommend stress management therapy -Follow up in 2-3 months -Strict return precautions provided    PAP 04/2021- NILM, HPV neg.  Due 2027  PDMP Reviewed  Carollee Leitz, MD

## 2022-08-11 ENCOUNTER — Encounter: Payer: Self-pay | Admitting: Family Medicine

## 2022-08-11 DIAGNOSIS — H6991 Unspecified Eustachian tube disorder, right ear: Secondary | ICD-10-CM | POA: Insufficient documentation

## 2022-08-11 DIAGNOSIS — L301 Dyshidrosis [pompholyx]: Secondary | ICD-10-CM | POA: Insufficient documentation

## 2022-08-11 NOTE — Assessment & Plan Note (Signed)
Chronic. Stable.  Refill Clobetasol 0.05% ointment BID prn

## 2022-08-11 NOTE — Assessment & Plan Note (Signed)
Normal ear exam.   Continue Flonase 2 sprays daily.  Instructed proper use of nasal spray Continue Xyzal 5 mg daily Follow up if symptoms do not improve or worsen

## 2022-09-22 ENCOUNTER — Encounter: Payer: Self-pay | Admitting: Family Medicine

## 2022-09-23 ENCOUNTER — Telehealth: Payer: Self-pay | Admitting: Family Medicine

## 2022-09-23 NOTE — Telephone Encounter (Signed)
Have she tried the over the counter patches before?    Has she tried the prescription patches before?  I didn't see it in her medication.

## 2022-09-23 NOTE — Telephone Encounter (Signed)
error 

## 2022-09-24 NOTE — Telephone Encounter (Signed)
noted 

## 2022-09-24 NOTE — Telephone Encounter (Signed)
Called Patient she states she cancelled the cruise today so do not worry about it.

## 2022-10-12 ENCOUNTER — Telehealth: Payer: BC Managed Care – PPO | Admitting: Physician Assistant

## 2022-10-12 DIAGNOSIS — J029 Acute pharyngitis, unspecified: Secondary | ICD-10-CM | POA: Diagnosis not present

## 2022-10-12 MED ORDER — AMOXICILLIN 500 MG PO CAPS: 500.0000 mg | ORAL_CAPSULE | Freq: Two times a day (BID) | ORAL | 0 refills | Status: DC

## 2022-10-12 NOTE — Progress Notes (Signed)
E-Visit for Sore Throat - Strep Symptoms  We are sorry that you are not feeling well.  Here is how we plan to help!  Based on what you have shared with me it is likely that you have strep pharyngitis.  Strep pharyngitis is inflammation and infection in the back of the throat.  This is an infection cause by bacteria and is treated with antibiotics.  I have prescribed Amoxicillin 500 mg twice a day for 10 days. For throat pain, we recommend over the counter oral pain relief medications such as acetaminophen or aspirin, or anti-inflammatory medications such as ibuprofen or naproxen sodium. Topical treatments such as oral throat lozenges or sprays may be used as needed. Strep infections are not as easily transmitted as other respiratory infections, however we still recommend that you avoid close contact with loved ones, especially the very young and elderly.  Remember to wash your hands thoroughly throughout the day as this is the number one way to prevent the spread of infection and wipe down door knobs and counters with disinfectant.   Home Care: Only take medications as instructed by your medical team. Complete the entire course of an antibiotic. Do not take these medications with alcohol. A steam or ultrasonic humidifier can help congestion.  You can place a towel over your head and breathe in the steam from hot water coming from a faucet. Avoid close contacts especially the very young and the elderly. Cover your mouth when you cough or sneeze. Always remember to wash your hands.  Get Help Right Away If: You develop worsening fever or sinus pain. You develop a severe head ache or visual changes. Your symptoms persist after you have completed your treatment plan.  Make sure you Understand these instructions. Will watch your condition. Will get help right away if you are not doing well or get worse.   Thank you for choosing an e-visit.  Your e-visit answers were reviewed by a board  certified advanced clinical practitioner to complete your personal care plan. Depending upon the condition, your plan could have included both over the counter or prescription medications.  Please review your pharmacy choice. Make sure the pharmacy is open so you can pick up prescription now. If there is a problem, you may contact your provider through [employer] and have the prescription routed to another pharmacy.  Your safety is important to [place]. If you have drug allergies check your prescription carefully.   For the next 24 hours you can use MyChart to ask questions about today's visit, request a non-urgent call back, or ask for a work or school excuse. You will get an email in the next two days asking about your experience. I hope that your e-visit has been valuable and will speed your recovery.    This appointment required 5-10 minutes of patient care (this includes precharting, chart review, review of results, face-to-face care, etc.).  Inda Coke PA-C

## 2022-10-21 ENCOUNTER — Telehealth: Payer: BC Managed Care – PPO | Admitting: Physician Assistant

## 2022-10-21 DIAGNOSIS — R3989 Other symptoms and signs involving the genitourinary system: Secondary | ICD-10-CM | POA: Diagnosis not present

## 2022-10-21 MED ORDER — SULFAMETHOXAZOLE-TRIMETHOPRIM 800-160 MG PO TABS: 1.0000 | ORAL_TABLET | Freq: Two times a day (BID) | ORAL | 0 refills | Status: DC

## 2022-10-21 NOTE — Progress Notes (Signed)
E-Visit for Urinary Problems  We are sorry that you are not feeling well.  Here is how we plan to help!  Based on what you shared with me it looks like you most likely have a simple urinary tract infection.  A UTI (Urinary Tract Infection) is a bacterial infection of the bladder.  Most cases of urinary tract infections are simple to treat but a key part of your care is to encourage you to drink plenty of fluids and watch your symptoms carefully.  I have prescribed Bactrim DS One tablet twice a day for 5 days.  Your symptoms should gradually improve. Call us if the burning in your urine worsens, you develop worsening fever, back pain or pelvic pain or if your symptoms do not resolve after completing the antibiotic.  Urinary tract infections can be prevented by drinking plenty of water to keep your body hydrated.  Also be sure when you wipe, wipe from front to back and don't hold it in!  If possible, empty your bladder every 4 hours.  HOME CARE Drink plenty of fluids Compete the full course of the antibiotics even if the symptoms resolve Remember, when you need to go.go. Holding in your urine can increase the likelihood of getting a UTI! GET HELP RIGHT AWAY IF: You cannot urinate You get a high fever Worsening back pain occurs You see blood in your urine You feel sick to your stomach or throw up You feel like you are going to pass out  MAKE SURE YOU  Understand these instructions. Will watch your condition. Will get help right away if you are not doing well or get worse.   Thank you for choosing an e-visit.  Your e-visit answers were reviewed by a board certified advanced clinical practitioner to complete your personal care plan. Depending upon the condition, your plan could have included both over the counter or prescription medications.  Please review your pharmacy choice. Make sure the pharmacy is open so you can pick up prescription now. If there is a problem, you may contact  your provider through MyChart messaging and have the prescription routed to another pharmacy.  Your safety is important to us. If you have drug allergies check your prescription carefully.   For the next 24 hours you can use MyChart to ask questions about today's visit, request a non-urgent call back, or ask for a work or school excuse. You will get an email in the next two days asking about your experience. I hope that your e-visit has been valuable and will speed your recovery.  I have spent 5 minutes in review of e-visit questionnaire, review and updating patient chart, medical decision making and response to patient.   Arnetta Odeh M Amyriah Buras, PA-C  

## 2022-11-06 ENCOUNTER — Ambulatory Visit: Payer: BC Managed Care – PPO | Admitting: Family Medicine

## 2022-12-10 ENCOUNTER — Encounter: Payer: Self-pay | Admitting: Family Medicine

## 2022-12-11 ENCOUNTER — Other Ambulatory Visit: Payer: Self-pay

## 2022-12-11 ENCOUNTER — Other Ambulatory Visit: Payer: Self-pay | Admitting: Family Medicine

## 2022-12-11 DIAGNOSIS — F39 Unspecified mood [affective] disorder: Secondary | ICD-10-CM

## 2022-12-11 MED ORDER — ONDANSETRON HCL 4 MG PO TABS: 4.0000 mg | ORAL_TABLET | Freq: Three times a day (TID) | ORAL | 0 refills | Status: DC | PRN

## 2022-12-11 MED ORDER — ALPRAZOLAM 0.25 MG PO TABS: 0.2500 mg | ORAL_TABLET | Freq: Every evening | ORAL | 0 refills | Status: DC | PRN

## 2022-12-11 NOTE — Progress Notes (Signed)
Patient travelling to [place]. Requesting refill of Xanax and Zofran Prescription sent for Xanax 0.25 mg qhs prn x 30 tabs and Zofran 4 mg q8h prn x 20 tabs PDMP reviewed and appropriate.  Carollee Leitz, MD

## 2023-01-22 ENCOUNTER — Telehealth: Payer: BC Managed Care – PPO | Admitting: Physician Assistant

## 2023-01-22 ENCOUNTER — Other Ambulatory Visit: Payer: Self-pay

## 2023-01-22 ENCOUNTER — Ambulatory Visit
Admission: EM | Admit: 2023-01-22 | Discharge: 2023-01-22 | Disposition: A | Discharge status: Other Acute Inpatient Hospital | Payer: BC Managed Care – PPO | Source: Home / Self Care | Attending: Family Medicine | Admitting: Family Medicine

## 2023-01-22 ENCOUNTER — Emergency Department: Payer: BC Managed Care – PPO

## 2023-01-22 ENCOUNTER — Emergency Department
Admission: EM | Admit: 2023-01-22 | Discharge: 2023-01-22 | Disposition: A | Discharge status: Home / Self Care | Payer: BC Managed Care – PPO | Attending: Emergency Medicine | Admitting: Emergency Medicine

## 2023-01-22 ENCOUNTER — Telehealth: Payer: Self-pay | Admitting: Family Medicine

## 2023-01-22 DIAGNOSIS — B9689 Other specified bacterial agents as the cause of diseases classified elsewhere: Secondary | ICD-10-CM

## 2023-01-22 DIAGNOSIS — J020 Streptococcal pharyngitis: Secondary | ICD-10-CM | POA: Insufficient documentation

## 2023-01-22 DIAGNOSIS — H6690 Otitis media, unspecified, unspecified ear: Secondary | ICD-10-CM | POA: Diagnosis not present

## 2023-01-22 DIAGNOSIS — E871 Hypo-osmolality and hyponatremia: Secondary | ICD-10-CM | POA: Insufficient documentation

## 2023-01-22 DIAGNOSIS — R509 Fever, unspecified: Secondary | ICD-10-CM | POA: Insufficient documentation

## 2023-01-22 DIAGNOSIS — Z8616 Personal history of COVID-19: Secondary | ICD-10-CM | POA: Insufficient documentation

## 2023-01-22 DIAGNOSIS — Z1152 Encounter for screening for COVID-19: Secondary | ICD-10-CM | POA: Insufficient documentation

## 2023-01-22 DIAGNOSIS — J069 Acute upper respiratory infection, unspecified: Secondary | ICD-10-CM

## 2023-01-22 DIAGNOSIS — J029 Acute pharyngitis, unspecified: Secondary | ICD-10-CM | POA: Diagnosis present

## 2023-01-22 LAB — GROUP A STREP BY PCR: Group A Strep by PCR: DETECTED — AB

## 2023-01-22 LAB — CBC WITH DIFFERENTIAL/PLATELET
Abs Immature Granulocytes: 0.05 10*3/uL (ref 0.00–0.07)
Basophils Absolute: 0 10*3/uL (ref 0.0–0.1)
Basophils Relative: 0 %
Eosinophils Absolute: 0 10*3/uL (ref 0.0–0.5)
Eosinophils Relative: 0 %
HCT: 37.6 % (ref 36.0–46.0)
Hemoglobin: 13.4 g/dL (ref 12.0–15.0)
Immature Granulocytes: 0 %
Lymphocytes Relative: 8 %
Lymphs Abs: 0.8 10*3/uL (ref 0.7–4.0)
MCH: 29.3 pg (ref 26.0–34.0)
MCHC: 35.6 g/dL (ref 30.0–36.0)
MCV: 82.3 fL (ref 80.0–100.0)
Monocytes Absolute: 0.7 10*3/uL (ref 0.1–1.0)
Monocytes Relative: 6 %
Neutro Abs: 9.5 10*3/uL — ABNORMAL HIGH (ref 1.7–7.7)
Neutrophils Relative %: 86 %
Platelets: 241 10*3/uL (ref 150–400)
RBC: 4.57 MIL/uL (ref 3.87–5.11)
RDW: 12.7 % (ref 11.5–15.5)
WBC: 11.2 10*3/uL — ABNORMAL HIGH (ref 4.0–10.5)
nRBC: 0 % (ref 0.0–0.2)

## 2023-01-22 LAB — BASIC METABOLIC PANEL
Anion gap: 10 (ref 5–15)
BUN: 11 mg/dL (ref 6–20)
CO2: 20 mmol/L — ABNORMAL LOW (ref 22–32)
Calcium: 8.5 mg/dL — ABNORMAL LOW (ref 8.9–10.3)
Chloride: 98 mmol/L (ref 98–111)
Creatinine, Ser: 0.73 mg/dL (ref 0.44–1.00)
GFR, Estimated: >60 mL/min (ref >60)
Glucose, Bld: 113 mg/dL — ABNORMAL HIGH (ref 70–99)
Potassium: 3.6 mmol/L (ref 3.5–5.1)
Sodium: 128 mmol/L — ABNORMAL LOW (ref 135–145)

## 2023-01-22 LAB — URINALYSIS, ROUTINE W REFLEX MICROSCOPIC
Bilirubin Urine: NEGATIVE
Glucose, UA: NEGATIVE mg/dL
Ketones, ur: 5 mg/dL — AB
Leukocytes,Ua: NEGATIVE
Nitrite: NEGATIVE
Protein, ur: NEGATIVE mg/dL
Specific Gravity, Urine: 1.008 (ref 1.005–1.030)
pH: 5 (ref 5.0–8.0)

## 2023-01-22 LAB — RESP PANEL BY RT-PCR (RSV, FLU A&B, COVID)  RVPGX2
Influenza A by PCR: NEGATIVE
Influenza B by PCR: NEGATIVE
Resp Syncytial Virus by PCR: NEGATIVE
SARS Coronavirus 2 by RT PCR: NEGATIVE

## 2023-01-22 LAB — LACTIC ACID, PLASMA: Lactic Acid, Venous: 0.7 mmol/L (ref 0.5–1.9)

## 2023-01-22 LAB — PREGNANCY, URINE: Preg Test, Ur: NEGATIVE

## 2023-01-22 MED ORDER — ONDANSETRON HCL 4 MG PO TABS: 4.0000 mg | ORAL_TABLET | Freq: Three times a day (TID) | ORAL | 0 refills | Status: DC | PRN

## 2023-01-22 MED ORDER — IBUPROFEN 600 MG PO TABS
600.0000 mg | ORAL_TABLET | Freq: Once | ORAL | Status: AC
  Administered 2023-01-22: 600 mg via ORAL

## 2023-01-22 MED ORDER — SODIUM CHLORIDE 0.9 % IV BOLUS
1000.0000 mL | Freq: Once | INTRAVENOUS | Status: AC
  Administered 2023-01-22: 1000 mL via INTRAVENOUS

## 2023-01-22 MED ORDER — IOHEXOL 300 MG/ML  SOLN
100.0000 mL | Freq: Once | INTRAMUSCULAR | Status: AC | PRN
  Administered 2023-01-22: 100 mL via INTRAVENOUS

## 2023-01-22 MED ORDER — AMOXICILLIN-POT CLAVULANATE 875-125 MG PO TABS: 1.0000 | ORAL_TABLET | Freq: Two times a day (BID) | ORAL | 0 refills | Status: DC

## 2023-01-22 NOTE — ED Notes (Signed)
Pt verbalizes understanding of discharge instructions. Opportunity for questioning and answers were provided. Pt discharged from ED to home with husband.  

## 2023-01-22 NOTE — ED Provider Notes (Signed)
[facility] Provider Note    Event Date/Time   First MD Initiated Contact with Patient 01/22/23 1501     (approximate)   History   Abdominal Pain and Back Pain   HPI  Carolyn Robles is a 37 y.o. female  who presents to the emergency department today from urgent care. Patient went to urgent care because of concern for high fevers, sore throat, lower abdominal pain, back pain. States symptoms started yesterday with low fever. Did recently return from [place], denies any sick contacts but acknowledges possibility of exposure during travel. At urgent care she was found to be strep positive. Blood work was also checked which showed a hyponatremia. Given concern for fevers, tachycardia and hyponatremia the patient was transferred to the emergency department.      Physical Exam   Triage Vital Signs: ED Triage Vitals  Enc Vitals Group     BP 01/22/23 1424 109/78     Pulse Rate 01/22/23 1421 96     Resp 01/22/23 1421 15     Temp 01/22/23 1421 (!) 102.7 F (39.3 C)     Temp Source 01/22/23 1421 Oral     SpO2 01/22/23 1424 97 %     Weight 01/22/23 1420 183 lb (83 kg)     Height 01/22/23 1420 5\' 6"  (1.676 m)     Head Circumference --      Peak Flow --      Pain Score 01/22/23 1421 7     Pain Loc --      Pain Edu? --      Excl. in GC? --     Most recent vital signs: Vitals:   01/22/23 1426 01/22/23 1500  BP:  105/71  Pulse: 95 91  Resp: 14 20  Temp:    SpO2: 97% 96%   General: Awake, alert, oriented. CV:  Good peripheral perfusion. Regular rate and rhythm. Resp:  Normal effort. Lungs clear. Abd:  No distention. Tender to palpation in the lower abdomen. No CVA tenderness.   ED Results / Procedures / Treatments   Labs (all labs ordered are listed, but only abnormal results are displayed) Labs Reviewed  URINALYSIS, ROUTINE W REFLEX MICROSCOPIC - Abnormal; Notable for the following components:      Result Value   Color, Urine YELLOW (*)     APPearance CLEAR (*)    Hgb urine dipstick LARGE (*)    Ketones, ur 5 (*)    Bacteria, UA RARE (*)    All other components within normal limits  CULTURE, BLOOD (ROUTINE X 2)  CULTURE, BLOOD (ROUTINE X 2)  LACTIC ACID, PLASMA  PREGNANCY, URINE  LACTIC ACID, PLASMA     EKG  None   RADIOLOGY I independently interpreted and visualized the CT abd/pel. My interpretation: No free air. No stranding. Radiology interpretation:  IMPRESSION:  1. No evidence of acute abnormality.  2. UPPER limits normal spleen size.      PROCEDURES:  Critical Care performed: No    MEDICATIONS ORDERED IN ED: Medications  sodium chloride 0.9 % bolus 1,000 mL (1,000 mLs Intravenous New Bag/Given 01/22/23 1523)     IMPRESSION / MDM / ASSESSMENT AND PLAN / ED COURSE  I reviewed the triage vital signs and the nursing notes.                              Differential diagnosis includes, but is not limited to,  strep, UTI, pyelo, appendicitis  Patient's presentation is most consistent with acute presentation with potential threat to life or bodily function.   The patient is on the cardiac monitor to evaluate for evidence of arrhythmia and/or significant heart rate changes.  Patient presented to the emergency department today from urgent care, where she went for symptoms including fever, lower abdominal pain, and found to be strep positive with hyponatremia.  Blood work here without lactic acidosis.  Patient's fever did improve and heart rate improved.  Patient was given IV fluids.  Did feel better.  At this point while technically patient did meet SIRS criteria with temperature and tachycardia I have low suspicion for significant septic infection given white blood cell count less than 12 and lactic acid level less than 1.  I think it is reasonable for patient be discharged home to continue on Augmentin which was prescribed earlier.  Discussed return precautions.    FINAL CLINICAL IMPRESSION(S) / ED  DIAGNOSES   Final diagnoses:  Strep pharyngitis    Note:  This document was prepared using Dragon voice recognition software and may include unintentional dictation errors.    Phineas Semen, MD 01/22/23 [phone]

## 2023-01-22 NOTE — Discharge Instructions (Addendum)
Your strep test is positive.  Your COVID, influenza and RSV test are negative.  I am worried that you may be septic from your strep.  Your sodium level is moderately low.  After some shared decision making you chose to go to the hospital via EMS.  You have been advised to follow up immediately in the emergency department for concerning signs or symptoms as discussed during your visit. If you declined EMS transport, please have a family member take you directly to the ED at this time. Do not delay.   Based on concerns about condition, if you do not follow up in the ED, you may risk poor outcomes including worsening of condition, delayed treatment and potentially life threatening issues. If you have declined to go to the ED at this time, you should call your PCP immediately to set up a follow up appointment.

## 2023-01-22 NOTE — Discharge Instructions (Signed)
Please seek medical attention for any high fevers, chest pain, shortness of breath, change in behavior, persistent vomiting, bloody stool or any other new or concerning symptoms.  

## 2023-01-22 NOTE — Telephone Encounter (Signed)
Pt called stating she has a fever of 103.7, body aches, chills and one of her tonsils are enlarged sent to access nurse

## 2023-01-22 NOTE — ED Notes (Addendum)
Patient is being discharged from the Urgent Care and sent to the Emergency Department via EMS . Per Dr. Rachael Darby, patient is in need of higher level of care due to hyponatremia. Patient is aware and verbalizes understanding of plan of care.  Vitals:   01/22/23 1201  BP: 114/80  Pulse: (!) 108  Resp: 16  Temp: (!) 103.1 F (39.5 C)  SpO2: 99%

## 2023-01-22 NOTE — ED Triage Notes (Addendum)
Pt c/o fever Tmax 104 this AM, bodyaches, fatigue & sore throat x1 day. Had E-Visit this AM & was given amoxi has had 1 dose so far. Took tylenol for fever around 0800

## 2023-01-22 NOTE — ED Triage Notes (Signed)
Pt arrives via EMS from urgent care where she was diagnosed with strep. Pt endorsing she had an e-visit this am and was given amoxicillin and she took one dose. She endorses she developed abdominal and back pain and that's when she went to urgent care. Pt was given 600mg  ibuprofen for fever of 103 at UC. Pt was also told her sodium was low.   121/64 99% ra 98 sinus AO4/GCS15

## 2023-01-22 NOTE — Progress Notes (Signed)

## 2023-01-22 NOTE — ED Provider Notes (Signed)
[facility]    CSN: [id number] Arrival date & time: 01/22/23  1154      History   Chief Complaint Chief Complaint  Patient presents with   Fever   Generalized Body Aches   Sore Throat   Fatigue    HPI Carolyn Robles is a 37 y.o. female.   HPI   Carolyn Robles presents for body aches, chills, ear pain, nasal congestion, sore throat on the left, fatigue, back pain, flank pain that started yesterday evening. No known sick contacts.  Tmax 104.5 F. Took 500 mg tylenol and took an amoxicillin prior to arrival. She got amoxicillin at an E-visit this morning.  Returned from [place] on Saturday.  Denies urinary symptoms. She is also on her period.  Says that the Accutane gives her back pain.  She has been in contact with others will have similar symptoms.  She tried Tylenol, ibuprofen and nasal spray without relief.    Past Medical History:  Diagnosis Date   Allergy    pollen, trees, etc    Anxiety 04/30/2022   Asthma    seasonal   C. difficile diarrhea    Confusion 08/24/2019   COVID-19    05/08/20 trouble taste and smell   FH: heart attack 04/14/2020   History of colposcopy    CIN 3 HPV +   Tick bite of left hip    03/2020   UTI (urinary tract infection)     Patient Active Problem List   Diagnosis Date Noted   Eustachian tube dysfunction, right 08/11/2022   Dyshidrotic eczema 08/11/2022   Anxiety 04/30/2022   History of abnormal cervical Pap smear 04/14/2020   Allergic rhinitis 04/14/2020   Acne vulgaris 10/29/2019   Pain in both wrists 10/29/2019   Polyarthralgia 08/24/2019   Cyclic citrullinated peptide (CCP) antibody positive 08/24/2019   B12 deficiency 08/24/2019   CIN III (cervical intraepithelial neoplasia grade III) with severe dysplasia 02/27/2018   Asthma, well controlled 10/07/2015    Past Surgical History:  Procedure Laterality Date   COLPOSCOPY     from 10 oclock to 2 oclock   HYSTEROSCOPY N/A 05/04/2018   Procedure: HYSTEROSCOPY;   Surgeon: Defrancesco, Prentice Docker, MD;  Location: [facility];  Service: Gynecology;  Laterality: N/A;   IUD REMOVAL N/A 05/04/2018   Procedure: INTRAUTERINE DEVICE (IUD) REMOVAL;  Surgeon: Herold Harms, MD;  Location: [facility];  Service: Gynecology;  Laterality: N/A;   LEEP     for h/o abnormal pap   RETAINED PLACENTA REMOVAL     with vaccum suction     OB History     Gravida  2   Para  2   Term  2   Preterm  0   AB  0   Living  2      SAB  0   IAB  0   Ectopic  0   Multiple  0   Live Births  2            Home Medications    Prior to Admission medications   Medication Sig Start Date End Date Taking? Authorizing Provider  ACCUTANE 40 MG capsule Take 40 mg by mouth daily. 12/30/22  Yes [provider]  albuterol (VENTOLIN HFA) 108 (90 Base) MCG/ACT inhaler INHALE 1 TO 2 PUFFS INTO THE LUNGS EVERY 6 (SIX) HOURS AS NEEDED FOR WHEEZING OR SHORTNESS OF BREATH. 04/30/22 04/30/23 Yes McLean-Scocuzza, Pasty Spillers, MD  ALPRAZolam Prudy Feeler) 0.25 MG tablet Take 1 tablet (  0.25 mg total) by mouth at bedtime as needed for anxiety. 12/11/22  Yes Dana Allan, MD  amoxicillin-clavulanate (AUGMENTIN) 875-125 MG tablet Take 1 tablet by mouth 2 (two) times daily. 01/22/23  Yes Margaretann Loveless, PA-C  Cholecalciferol (VITAMIN D3 PO) Take by mouth.   Yes [provider]  clindamycin (CLINDAGEL) 1 % gel Apply topically 2 (two) times daily. 04/30/22  Yes McLean-Scocuzza, Pasty Spillers, MD  clobetasol ointment (TEMOVATE) 0.05 % Apply 1 Application topically 2 (two) times daily. 08/06/22  Yes Dana Allan, MD  Cyanocobalamin (VITAMIN B12 PO) Take by mouth.   Yes [provider]  cyclobenzaprine (FLEXERIL) 5 MG tablet Take 1 tablet (5 mg total) by mouth 3 (three) times daily as needed for muscle spasms. 04/30/22  Yes McLean-Scocuzza, Pasty Spillers, MD  Dapsone (ACZONE) 7.5 % GEL Apply a small amount to affected areas on the skin every day in the morning. 04/30/22  Yes  McLean-Scocuzza, Pasty Spillers, MD  fluticasone (FLONASE) 50 MCG/ACT nasal spray Place 2 sprays into both nostrils daily as needed for allergies or rhinitis. 08/06/22  Yes Dana Allan, MD  levocetirizine (XYZAL) 5 MG tablet Take 1 tablet (5 mg total) by mouth daily. 04/30/22  Yes McLean-Scocuzza, Pasty Spillers, MD  meloxicam (MOBIC) 7.5 MG tablet Take 1 tablet (7.5 mg total) by mouth daily as needed for pain. 04/30/22  Yes McLean-Scocuzza, Pasty Spillers, MD  montelukast (SINGULAIR) 10 MG tablet TAKE 1 TABLET BY MOUTH EVERYDAY AT BEDTIME 04/30/22  Yes McLean-Scocuzza, Pasty Spillers, MD  Omega-3 Fatty Acids (FISH OIL PO) Take by mouth.   Yes [provider]  ondansetron (ZOFRAN) 4 MG tablet Take 1 tablet (4 mg total) by mouth every 8 (eight) hours as needed for nausea or vomiting. 12/11/22  Yes Dana Allan, MD  phentermine (ADIPEX-P) 37.5 MG tablet Take 0.5 tablets (18.75 mg total) by mouth daily before breakfast. 06/24/22  Yes McLean-Scocuzza, Pasty Spillers, MD  sulfamethoxazole-trimethoprim (BACTRIM DS) 800-160 MG tablet Take 1 tablet by mouth 2 (two) times daily. 10/21/22  Yes Margaretann Loveless, PA-C  tretinoin (RETIN-A) 0.025 % cream Apply topically at bedtime. With moisturizer 04/30/22  Yes McLean-Scocuzza, Pasty Spillers, MD    Family History Family History  Problem Relation Age of Onset   Heart disease Mother        MI died when pt was young; mom died when was 49 and mom was adopted    Diabetes Father    Depression Sister    Miscarriages / [place] Sister    Alcohol abuse Brother    Depression Brother    Drug abuse Brother     Social History Social History   Tobacco Use   Smoking status: Never   Smokeless tobacco: Never  Vaping Use   Vaping Use: Never used  Substance Use Topics   Alcohol use: Yes    Alcohol/week: 2.0 standard drinks of alcohol    Types: 2 Cans of beer per week   Drug use: No     Allergies   Apple juice and Doxycycline   Review of Systems Review of Systems: negative unless  otherwise stated in HPI.      Physical Exam Triage Vital Signs ED Triage Vitals  Enc Vitals Group     BP 01/22/23 1201 114/80     Pulse Rate 01/22/23 1201 (!) 108     Resp 01/22/23 1201 16     Temp 01/22/23 1201 (!) 103.1 F (39.5 C)     Temp Source 01/22/23 1201 Oral  SpO2 01/22/23 1201 99 %     Weight 01/22/23 1207 180 lb (81.6 kg)     Height 01/22/23 1207 5\' 6"  (1.676 m)     Head Circumference --      Peak Flow --      Pain Score 01/22/23 1207 9     Pain Loc --      Pain Edu? --      Excl. in GC? --    No data found.  Updated Vital Signs BP 114/80 (BP Location: Left Arm)   Pulse (!) 108   Temp (!) 103.1 F (39.5 C) (Oral)   Resp 16   Ht 5\' 6"  (1.676 m)   Wt 81.6 kg   SpO2 99%   BMI 29.05 kg/m   Visual Acuity Right Eye Distance:   Left Eye Distance:   Bilateral Distance:    Right Eye Near:   Left Eye Near:    Bilateral Near:     Physical Exam GEN:     alert, ill but non-toxic appearing female in no distress    HENT:  mucus membranes moist, oropharyngeal with tonsillar hypertrophy, white exudates and erythema, clear nasal discharge, bilateral TM normal EYES:   pupils equal and reactive, no scleral injection or discharge NECK:  normal ROM, no lymphadenopathy, no meningismus   RESP:  no increased work of breathing, clear to auscultation bilaterally CVS:   regular rhythm, tachycardic Skin:   Very warm and dry, no rash on visible skin    UC Treatments / Results  Labs (all labs ordered are listed, but only abnormal results are displayed) Labs Reviewed  GROUP A STREP BY PCR - Abnormal; Notable for the following components:      Result Value   Group A Strep by PCR DETECTED (*)    All other components within normal limits  CBC WITH DIFFERENTIAL/PLATELET - Abnormal; Notable for the following components:   WBC 11.2 (*)    Neutro Abs 9.5 (*)    All other components within normal limits  BASIC METABOLIC PANEL - Abnormal; Notable for the following  components:   Sodium 128 (*)    CO2 20 (*)    Glucose, Bld 113 (*)    Calcium 8.5 (*)    All other components within normal limits  RESP PANEL BY RT-PCR (RSV, FLU A&B, COVID)  [id number]    EKG   Radiology No results found.  Procedures Procedures (including critical care time)  Medications Ordered in UC Medications  ibuprofen (ADVIL) tablet 600 mg (600 mg Oral Given 01/22/23 1316)    Initial Impression / Assessment and Plan / UC Course  I have reviewed the triage vital signs and the nursing notes.  Pertinent labs & imaging results that were available during my care of the patient were reviewed by me and considered in my medical decision making (see chart for details).       Pt is a 37 y.o. female who presents for 1 day of flulike symptoms. Carolyn Robles is febrile and tachycardic, 103.70F. Satting well on room air.  On chart review, she was prescribed amoxicillin 875 twice daily for presumed otitis media.  She does not have otitis media on exam but instead has oropharyngeal erythema with enlarged erythematous tonsils with exudate.   She is ill appearing, well hydrated, without respiratory distress. She is meeting sepsis criteria.  BMP obtained to assess for acute organ damage.  She does have moderate hyponatremia sodium 128 with mild acidosis.  Serum creatinine is normal.  Has mild leukocytosis with left shift (WBC 11.2).  Pulmonary exam is unremarkable.  COVID and influenza testing obtained and were negative.  Strep PCR is positive.    After shared decision making patient requested to be transferred via EMS to the hospital for moderate hyponatremia and possible Streptococcus sepsis given the quick onset and severity of her symptoms..  EMS personnel updated upon arrival.  EMS personnel updated upon arrival.  Called and spoke with charge RN regarding patient's arrival by EMS .    Discussed MDM, treatment plan and plan for follow-up with patient who agrees with plan.     Final Clinical  Impressions(s) / UC Diagnoses   Final diagnoses:  Streptococcal pharyngitis  Hyponatremia     Discharge Instructions      Your strep test is positive.  Your COVID, influenza and RSV test are negative.  I am worried that you may be septic from your strep.  Your sodium level is moderately low.  After some shared decision making you chose to go to the hospital via EMS.  You have been advised to follow up immediately in the emergency department for concerning signs or symptoms as discussed during your visit. If you declined EMS transport, please have a family member take you directly to the ED at this time. Do not delay.   Based on concerns about condition, if you do not follow up in the ED, you may risk poor outcomes including worsening of condition, delayed treatment and potentially life threatening issues. If you have declined to go to the ED at this time, you should call your PCP immediately to set up a follow up appointment.       ED Prescriptions   None    PDMP not reviewed this encounter.   Katha CabalBrimage, Carolyn Rambo, DO 01/22/23 1400

## 2023-01-23 ENCOUNTER — Telehealth: Payer: Self-pay

## 2023-01-23 LAB — CULTURE, BLOOD (ROUTINE X 2): Special Requests: ADEQUATE

## 2023-01-23 NOTE — Transitions of Care (Post Inpatient/ED Visit) (Signed)
   01/23/2023  Name: CINDYLEE RUITER MRN: [id number] DOB: [birth year]  Today's TOC FU Call Status: Today's TOC FU Call Status:: Unsuccessul Call (1st Attempt) Unsuccessful Call (1st Attempt) Date: 01/23/23  Attempted to reach the patient regarding the most recent Inpatient/ED visit.  Follow Up Plan: Additional outreach attempts will be made to reach the patient to complete the Transitions of Care (Post Inpatient/ED visit) call.   Signature  Valentino Nose, RN

## 2023-01-24 LAB — CULTURE, BLOOD (ROUTINE X 2): Culture: NO GROWTH

## 2023-01-24 NOTE — Transitions of Care (Post Inpatient/ED Visit) (Signed)
   01/24/2023  Name: GERARDO GIAMMARINO MRN: [id number] DOB: 1986-03-18  Today's TOC FU Call Status: Today's TOC FU Call Status:: Successful TOC FU Call Competed Unsuccessful Call (1st Attempt) Date: 01/23/23 [facility] FU Call Complete Date: 01/24/23  Transition Care Management Follow-up Telephone Call Date of Discharge: 01/22/23 Discharge Facility[facility]) Type of Discharge: Emergency Department How have you been since you were released from the hospital?: Better Any questions or concerns?: No  Items Reviewed: Did you receive and understand the discharge instructions provided?: Yes Medications obtained and verified?: Yes (Medications Reviewed) Any new allergies since your discharge?: No Dietary orders reviewed?: NA Do you have support at home?: Yes People in Home: spouse Name of Support/Comfort Primary Source: [facility] and Equipment/Supplies: Were Home Health Services Ordered?: NA Any new equipment or medical supplies ordered?: NA  Functional Questionnaire: Do you need assistance with bathing/showering or dressing?: No Do you need assistance with meal preparation?: No Do you need assistance with eating?: No Do you have difficulty maintaining continence: No Do you need assistance with getting out of bed/getting out of a chair/moving?: No Do you have difficulty managing or taking your medications?: No  Follow up appointments reviewed: PCP Follow-up appointment confirmed?: NA Specialist Hospital Follow-up appointment confirmed?: NA Do you need transportation to your follow-up appointment?: No Do you understand care options if your condition(s) worsen?: Yes-patient verbalized understanding    SIGNATURE Valentino Nose, RN

## 2023-01-25 LAB — CULTURE, BLOOD (ROUTINE X 2): Culture: NO GROWTH

## 2023-01-27 LAB — CULTURE, BLOOD (ROUTINE X 2): Special Requests: ADEQUATE

## 2023-02-11 ENCOUNTER — Encounter: Payer: Self-pay | Admitting: Family Medicine

## 2023-02-15 ENCOUNTER — Telehealth: Payer: BC Managed Care – PPO | Admitting: Nurse Practitioner

## 2023-02-15 DIAGNOSIS — S1096XA Insect bite of unspecified part of neck, initial encounter: Secondary | ICD-10-CM

## 2023-02-15 NOTE — Progress Notes (Signed)
Based on what you shared with me it looks like you have tick bite disease,that should be evaluated in a face to face office visit. You need to be evaluated and tested because you are allergic to doxycycline. WHen you are allergic to doxycycline there are 2 different treatments  depending on if you have RMSF or possibly lyme disease. You need to be seen ASASP because these can cause long term effects if go untreated.  NOTE: There will be NO CHARGE for this eVisit   If you are having a true medical emergency please call 911.      For an urgent face to face visit, [place] has six urgent care centers for your convenience:     [facility] at [facility] Directions [phone] [address] [place], [place] [address]    [facility]) Get Driving Directions [phone] [address] [place], [place] [address]  [facility] Center [facility]) Get Driving Directions [phone] [address] [place],  [place]  [address]  [facility] at [facility] Get Driving Directions [phone] [address] [place], [place] [address]   [facility] Care at [facility] Get Driving Directions  [phone] [address] [place], [place] [address]   [facility] Urgent Care at [facility] Directions [phone] [address] [place], [place] [address]  Your MyChart E-visit questionnaire answers were reviewed by a board certified advanced clinical practitioner to complete your personal care plan based on your specific symptoms.  Thank you for using e-Visits.

## 2023-02-24 ENCOUNTER — Encounter: Payer: Self-pay | Admitting: Family Medicine

## 2023-02-24 ENCOUNTER — Telehealth (INDEPENDENT_AMBULATORY_CARE_PROVIDER_SITE_OTHER): Payer: BC Managed Care – PPO | Admitting: Family Medicine

## 2023-02-24 VITALS — Ht 66.0 in | Wt 188.2 lb

## 2023-02-24 DIAGNOSIS — F39 Unspecified mood [affective] disorder: Secondary | ICD-10-CM | POA: Diagnosis not present

## 2023-02-24 DIAGNOSIS — E669 Obesity, unspecified: Secondary | ICD-10-CM

## 2023-02-24 NOTE — Progress Notes (Signed)
Virtual Visit via Video note  I connected with Carolyn Robles on 03/10/23 at 1200 by video and verified that I am speaking with the correct person using two identifiers. Carolyn Robles is currently located at home and is currently with during visit. The provider, Dana Allan, MD is located in their home at time of visit.  I discussed the limitations, risks, security and privacy concerns of performing an evaluation and management service by video and the availability of in person appointments. I also discussed with the patient that there may be a patient responsible charge related to this service. The patient expressed understanding and agreed to proceed.  Subjective: PCP: Dana Allan, MD  Chief Complaint  Patient presents with   Weight Management Screening    HPI Requesting weight loss medication.  Interested in Carolyn Robles.  Sedentary lifestyle.  24-hour recall includes  Breakfast-coffee Lunch-8 ounce steak, fries, broccoli, macaroon's Dinner-fried shrimp  She reports she thinks she eats too much.  Has not increased activity.  Does not portion control.  Endorses snacking on macaroon's.  No previous history of prediabetes or gestational diabetes no family history of diabetes  Previously tried phentermine greater than 1 year for weight loss.  Mood disorder. Reports not currently taking Xanax.  Review of PDMP indicates prescription refilled 02/24.  Given indication not taking medication will not refill medication.   ROS: Per HPI  Current Outpatient Medications:    ACCUTANE 40 MG capsule, Take 40 mg by mouth daily., Disp: , Rfl:    albuterol (VENTOLIN HFA) 108 (90 Base) MCG/ACT inhaler, INHALE 1 TO 2 PUFFS INTO THE LUNGS EVERY 6 (SIX) HOURS AS NEEDED FOR WHEEZING OR SHORTNESS OF BREATH., Disp: 18 g, Rfl: 12   clindamycin (CLINDAGEL) 1 % gel, Apply topically 2 (two) times daily., Disp: 30 g, Rfl: 11   clobetasol ointment (TEMOVATE) 0.05 %, Apply 1 Application topically 2  (two) times daily., Disp: 30 g, Rfl: 1   Cyanocobalamin (VITAMIN B12 PO), Take by mouth., Disp: , Rfl:    cyclobenzaprine (FLEXERIL) 5 MG tablet, Take 1 tablet (5 mg total) by mouth 3 (three) times daily as needed for muscle spasms., Disp: 30 tablet, Rfl: 2   Dapsone (ACZONE) 7.5 % GEL, Apply a small amount to affected areas on the skin every day in the morning., Disp: 60 g, Rfl: 0   fluticasone (FLONASE) 50 MCG/ACT nasal spray, Place 2 sprays into both nostrils daily as needed for allergies or rhinitis., Disp: 16 g, Rfl: 11   levocetirizine (XYZAL) 5 MG tablet, Take 1 tablet (5 mg total) by mouth daily., Disp: 90 tablet, Rfl: 3   meloxicam (MOBIC) 7.5 MG tablet, Take 1 tablet (7.5 mg total) by mouth daily as needed for pain., Disp: 90 tablet, Rfl: 0   montelukast (SINGULAIR) 10 MG tablet, TAKE 1 TABLET BY MOUTH EVERYDAY AT BEDTIME, Disp: 90 tablet, Rfl: 3   Omega-3 Fatty Acids (FISH OIL PO), Take by mouth., Disp: , Rfl:    ondansetron (ZOFRAN) 4 MG tablet, Take 1 tablet (4 mg total) by mouth every 8 (eight) hours as needed., Disp: 20 tablet, Rfl: 0   tretinoin (RETIN-A) 0.025 % cream, Apply topically at bedtime. With moisturizer, Disp: 45 g, Rfl: 11  Observations/Objective: Physical Exam Pulmonary:     Effort: Pulmonary effort is normal.  Neurological:     Mental Status: She is alert and oriented to person, place, and time. Mental status is at baseline.  Psychiatric:  Mood and Affect: Mood normal.        Behavior: Behavior normal.        Thought Content: Thought content normal.        Judgment: Judgment normal.    Assessment and Plan: Obesity (BMI 30-39.9) Assessment & Plan: Chronic.  Increasing weight.  Recent UPT negative Currently sedentary and nutrition not adequate. Offered nutrition consult, patient declined Recommend healthy weight and wellness clinic for structured approach to lifelong weight loss Will check labs today Discussed initiation of tirzepatide for weight  loss.  Likely not covered under insurance and with coupon out-of-pocket pay likely greater than 500 monthly.  Patient will check with insurance to see if will be covered.    Orders: -     Hemoglobin A1c -     Vitamin B12 -     TSH -     Comprehensive metabolic panel -     VITAMIN D 25 Hydroxy (Vit-D Deficiency, Fractures) -     Lipid panel -     CBC with Differential/Platelet -     Amb ref to Medical Nutrition Therapy-MNT  Mood disorder (HCC) Assessment & Plan: Chronic. Well controlled.  Has not had to use Xanax for anxiety.  -Given not taking regularly, no concern for withdrawal, will discontinue Xanax. Discussed with patient and is agreeable given increasing risks of medication. -Recommend stress management therapy      Follow Up Instructions: Return if symptoms worsen or fail to improve.    I discussed the assessment and treatment plan with the patient. The patient was provided an opportunity to ask questions and all were answered. The patient agreed with the plan and demonstrated an understanding of the instructions.   The patient was advised to call back or seek an in-person evaluation if the symptoms worsen or if the condition fails to improve as anticipated.  The above assessment and management plan was discussed with the patient. The patient verbalized understanding of and has agreed to the management plan. Patient is aware to call the clinic if symptoms persist or worsen. Patient is aware when to return to the clinic for a follow-up visit. Patient educated on when it is appropriate to go to the emergency department.   Total of 45 minutes spent with patient, greater than 50% of time spent face to face on counseling and coordination of care, specifically nutrition goals, setting small goals for long-term success with weight loss management, reviewing healthy weight and wellness clinic options, review of tirzepatide, indications, risks/ benefits and side effects.     Dana Allan, MD

## 2023-02-24 NOTE — Patient Instructions (Addendum)
It was a pleasure seeing you today. Thank you for allowing me to take part in your health care.  Our goals for today as we discussed include:  Look at [facility] website to see if this may be an option for you with your weight loss. [facility] [address] [employer] [phone]   Fasting blood work.  Once received and if appropriate can start Zepbound 2.5 mg weekly. Will need to send me a MyChart message on week 3 to let me know how you are doing.   Recommend Tetanus Vaccination.  This is given every 10 years.   Discontinue Xanax as indicated not taking medication.    If you have any questions or concerns, please do not hesitate to call the office at [phone].  I look forward to our next visit and until then take care and stay safe.  Regards,   Dana Allan, MD   [facility]

## 2023-02-25 ENCOUNTER — Encounter: Payer: Self-pay | Admitting: Family Medicine

## 2023-02-26 LAB — CBC WITH DIFFERENTIAL/PLATELET
Basophils Absolute: 0 10*3/uL (ref 0.0–0.2)
Basos: 0 %
EOS (ABSOLUTE): 0.2 10*3/uL (ref 0.0–0.4)
Eos: 3 %
Hematocrit: 38.2 % (ref 34.0–46.6)
Hemoglobin: 13.3 g/dL (ref 11.1–15.9)
Immature Grans (Abs): 0 10*3/uL (ref 0.0–0.1)
Immature Granulocytes: 0 %
Lymphocytes Absolute: 1.9 10*3/uL (ref 0.7–3.1)
Lymphs: 33 %
MCH: 29.8 pg (ref 26.6–33.0)
MCHC: 34.8 g/dL (ref 31.5–35.7)
MCV: 86 fL (ref 79–97)
Monocytes Absolute: 0.4 10*3/uL (ref 0.1–0.9)
Monocytes: 6 %
Neutrophils Absolute: 3.4 10*3/uL (ref 1.4–7.0)
Neutrophils: 58 %
Platelets: 305 10*3/uL (ref 150–450)
RBC: 4.47 x10E6/uL (ref 3.77–5.28)
RDW: 13.1 % (ref 11.7–15.4)
WBC: 5.9 10*3/uL (ref 3.4–10.8)

## 2023-02-26 LAB — COMPREHENSIVE METABOLIC PANEL
ALT: 19 IU/L (ref 0–32)
AST: 18 IU/L (ref 0–40)
Albumin/Globulin Ratio: 1.9 (ref 1.2–2.2)
Albumin: 4.6 g/dL (ref 3.9–4.9)
Alkaline Phosphatase: 61 IU/L (ref 44–121)
BUN/Creatinine Ratio: 17 (ref 9–23)
BUN: 12 mg/dL (ref 6–20)
Bilirubin Total: 0.5 mg/dL (ref 0.0–1.2)
CO2: 20 mmol/L (ref 20–29)
Calcium: 9.1 mg/dL (ref 8.7–10.2)
Chloride: 103 mmol/L (ref 96–106)
Creatinine, Ser: 0.69 mg/dL (ref 0.57–1.00)
Globulin, Total: 2.4 g/dL (ref 1.5–4.5)
Glucose: 106 mg/dL — ABNORMAL HIGH (ref 70–99)
Potassium: 4.6 mmol/L (ref 3.5–5.2)
Sodium: 139 mmol/L (ref 134–144)
Total Protein: 7 g/dL (ref 6.0–8.5)
eGFR: 115 mL/min/{1.73_m2} (ref >59)

## 2023-02-26 LAB — LIPID PANEL
Chol/HDL Ratio: 5.9 ratio — ABNORMAL HIGH (ref 0.0–4.4)
Cholesterol, Total: 236 mg/dL — ABNORMAL HIGH (ref 100–199)
HDL: 40 mg/dL (ref >39)
LDL Chol Calc (NIH): 173 mg/dL — ABNORMAL HIGH (ref 0–99)
Triglycerides: 125 mg/dL (ref 0–149)
VLDL Cholesterol Cal: 23 mg/dL (ref 5–40)

## 2023-02-26 LAB — VITAMIN B12: Vitamin B-12: 791 pg/mL (ref 232–1245)

## 2023-02-26 LAB — VITAMIN D 25 HYDROXY (VIT D DEFICIENCY, FRACTURES): Vit D, 25-Hydroxy: 60.6 ng/mL (ref 30.0–100.0)

## 2023-02-26 LAB — HEMOGLOBIN A1C
Est. average glucose Bld gHb Est-mCnc: 114 mg/dL
Hgb A1c MFr Bld: 5.6 % (ref 4.8–5.6)

## 2023-02-26 LAB — TSH: TSH: 1.63 u[IU]/mL (ref 0.450–4.500)

## 2023-02-26 NOTE — Telephone Encounter (Signed)
Pt called in staying that she would like a phone call when her lab results comes back. Labs were drawn 5/14.

## 2023-03-10 DIAGNOSIS — E669 Obesity, unspecified: Secondary | ICD-10-CM | POA: Insufficient documentation

## 2023-03-10 NOTE — Assessment & Plan Note (Signed)
Chronic. Well controlled.  Has not had to use Xanax for anxiety.  -Given not taking regularly, no concern for withdrawal, will discontinue Xanax. Discussed with patient and is agreeable given increasing risks of medication. -Recommend stress management therapy

## 2023-03-10 NOTE — Assessment & Plan Note (Signed)
Chronic.  Increasing weight.  Recent UPT negative Currently sedentary and nutrition not adequate. Offered nutrition consult, patient declined Recommend healthy weight and wellness clinic for structured approach to lifelong weight loss Will check labs today Discussed initiation of tirzepatide for weight loss.  Likely not covered under insurance and with coupon out-of-pocket pay likely greater than 500 monthly.  Patient will check with insurance to see if will be covered.

## 2023-04-08 ENCOUNTER — Encounter: Payer: Self-pay | Admitting: Family Medicine

## 2023-05-05 ENCOUNTER — Encounter: Payer: BC Managed Care – PPO | Admitting: Family Medicine

## 2023-05-08 ENCOUNTER — Ambulatory Visit (INDEPENDENT_AMBULATORY_CARE_PROVIDER_SITE_OTHER): Payer: Medicaid Other | Admitting: Internal Medicine

## 2023-05-08 ENCOUNTER — Encounter: Payer: Self-pay | Admitting: Internal Medicine

## 2023-05-08 VITALS — BP 100/62 | Resp 16 | Ht 66.0 in | Wt 192.8 lb

## 2023-05-08 DIAGNOSIS — L4050 Arthropathic psoriasis, unspecified: Secondary | ICD-10-CM | POA: Insufficient documentation

## 2023-05-08 DIAGNOSIS — Z6828 Body mass index (BMI) 28.0-28.9, adult: Secondary | ICD-10-CM | POA: Insufficient documentation

## 2023-05-08 DIAGNOSIS — J45998 Other asthma: Secondary | ICD-10-CM | POA: Insufficient documentation

## 2023-05-08 DIAGNOSIS — Z6831 Body mass index (BMI) 31.0-31.9, adult: Secondary | ICD-10-CM

## 2023-05-08 DIAGNOSIS — E663 Overweight: Secondary | ICD-10-CM | POA: Insufficient documentation

## 2023-05-08 DIAGNOSIS — L700 Acne vulgaris: Secondary | ICD-10-CM

## 2023-05-08 DIAGNOSIS — E7800 Pure hypercholesterolemia, unspecified: Secondary | ICD-10-CM | POA: Insufficient documentation

## 2023-05-08 DIAGNOSIS — E6609 Other obesity due to excess calories: Secondary | ICD-10-CM | POA: Insufficient documentation

## 2023-05-08 DIAGNOSIS — F418 Other specified anxiety disorders: Secondary | ICD-10-CM | POA: Diagnosis not present

## 2023-05-08 MED ORDER — TIRZEPATIDE-WEIGHT MANAGEMENT 2.5 MG/0.5ML ~~LOC~~ SOAJ: 2.5000 mg | SUBCUTANEOUS | 0 refills | Status: DC

## 2023-05-08 NOTE — Assessment & Plan Note (Signed)
Encourage low-fat diet and exercise for weight loss

## 2023-05-08 NOTE — Assessment & Plan Note (Signed)
Managed with Xanax as needed

## 2023-05-08 NOTE — Assessment & Plan Note (Signed)
Continue meloxicam and clobetasol as needed

## 2023-05-08 NOTE — Assessment & Plan Note (Signed)
Continue Flonase, Xyzal, montelukast and albuterol as needed

## 2023-05-08 NOTE — Progress Notes (Signed)
HPI  Patient presents the clinic today to establish care and for management of the conditions listed below.  Anxiety: Situational. She takes xanax as needed. She is not currently seeing a therapist. She denies SI/HI  Asthma: Seasonal. She denies chronic cough or shortness of breath. She takes flonase, xyzal, montelukast and albuterol as needed. There are no PFT's on file. She no longer follows with allergy and asthma.  HLD: Her last LDL was 173, triglycerides 125, 02/2023.  She is not taking any cholesterol-lowering medication at this time.  She tries to consume low-fat diet.  Acne: Managed with clindamycin. She follows with dermatologist.  Psoriatic arthritis: Managed with meloxicam and clobetasol. She no longer follows with rheumatology.   Past Medical History:  Diagnosis Date   Allergy    pollen, trees, etc    Anxiety 04/30/2022   Asthma    seasonal   C. difficile diarrhea    Confusion 08/24/2019   COVID-19    05/08/20 trouble taste and smell   FH: heart attack 04/14/2020   History of colposcopy    CIN 3 HPV +   Tick bite of left hip    03/2020   UTI (urinary tract infection)     Current Outpatient Medications  Medication Sig Dispense Refill   clindamycin (CLINDAGEL) 1 % gel Apply topically 2 (two) times daily. 30 g 11   clobetasol ointment (TEMOVATE) 0.05 % Apply 1 Application topically 2 (two) times daily. 30 g 1   Cyanocobalamin (VITAMIN B12 PO) Take by mouth.     cyclobenzaprine (FLEXERIL) 5 MG tablet Take 1 tablet (5 mg total) by mouth 3 (three) times daily as needed for muscle spasms. 30 tablet 2   Dapsone (ACZONE) 7.5 % GEL Apply a small amount to affected areas on the skin every day in the morning. 60 g 0   fluticasone (FLONASE) 50 MCG/ACT nasal spray Place 2 sprays into both nostrils daily as needed for allergies or rhinitis. 16 g 11   levocetirizine (XYZAL) 5 MG tablet Take 1 tablet (5 mg total) by mouth daily. 90 tablet 3   meloxicam (MOBIC) 7.5 MG tablet Take 1  tablet (7.5 mg total) by mouth daily as needed for pain. 90 tablet 0   montelukast (SINGULAIR) 10 MG tablet TAKE 1 TABLET BY MOUTH EVERYDAY AT BEDTIME 90 tablet 3   tretinoin (RETIN-A) 0.025 % cream Apply topically at bedtime. With moisturizer 45 g 11   ACCUTANE 40 MG capsule Take 40 mg by mouth daily.     albuterol (VENTOLIN HFA) 108 (90 Base) MCG/ACT inhaler INHALE 1 TO 2 PUFFS INTO THE LUNGS EVERY 6 (SIX) HOURS AS NEEDED FOR WHEEZING OR SHORTNESS OF BREATH. 18 g 12   Omega-3 Fatty Acids (FISH OIL PO) Take by mouth. (Patient not taking: Reported on 05/08/2023)     ondansetron (ZOFRAN) 4 MG tablet Take 1 tablet (4 mg total) by mouth every 8 (eight) hours as needed. (Patient not taking: Reported on 05/08/2023) 20 tablet 0   No current facility-administered medications for this visit.    Allergies  Allergen Reactions   Apple    Apple Juice     Or fresh fruit throat itching    Doxycycline    Spironolactone Photosensitivity    Family History  Problem Relation Age of Onset   Heart disease Mother        MI died when pt was young; mom died when was 14 and mom was adopted    Diabetes Father  Depression Sister    Miscarriages / [place] Sister    Alcohol abuse Brother    Depression Brother    Drug abuse Brother     Social History   Socioeconomic History   Marital status: Married    Spouse name: Not on file   Number of children: Not on file   Years of education: Not on file   Highest education level: Not on file  Occupational History   Not on file  Tobacco Use   Smoking status: Never   Smokeless tobacco: Never  Vaping Use   Vaping status: Never Used  Substance and Sexual Activity   Alcohol use: Yes    Alcohol/week: 2.0 standard drinks of alcohol    Types: 2 Cans of beer per week   Drug use: No   Sexual activity: Yes    Birth control/protection: None  Other Topics Concern   Not on file  Social History Narrative   College ed    Receptionist at [employer]    Married    2  kids    Grew up in [place] with siblings   Social Determinants of Corporate investment banker Strain: Not on file  Food Insecurity: Not on file  Transportation Needs: Not on file  Physical Activity: Not on file  Stress: Not on file  Social Connections: Not on file  Intimate Partner Violence: Not on file    ROS:  Constitutional: Denies fever, malaise, fatigue, headache or abrupt weight changes.  HEENT: Denies eye pain, eye redness, ear pain, ringing in the ears, wax buildup, runny nose, nasal congestion, bloody nose, or sore throat. Respiratory: Denies difficulty breathing, shortness of breath, cough or sputum production.   Cardiovascular: Denies chest pain, chest tightness, palpitations or swelling in the hands or feet.  Gastrointestinal: Denies abdominal pain, bloating, constipation, diarrhea or blood in the stool.  GU: Denies frequency, urgency, pain with urination, blood in urine, odor or discharge. Musculoskeletal: Denies decrease in range of motion, difficulty with gait, muscle pain or joint pain and swelling.  Skin: Patient reports rash of hands.  Denies redness, lesions or ulcercations.  Neurological: Denies dizziness, difficulty with memory, difficulty with speech or problems with balance and coordination.  Psych: Patient has a history of anxiety.  Denies depression, SI/HI.  No other specific complaints in a complete review of systems (except as listed in HPI above).  PE:  BP 100/62 (BP Location: Left Arm, Patient Position: Sitting, Cuff Size: Normal)   Resp 16   Ht 5\' 6"  (1.676 m)   Wt 192 lb 12.8 oz (87.5 kg)   BMI 31.12 kg/m  Wt Readings from Last 3 Encounters:  05/08/23 192 lb 12.8 oz (87.5 kg)  02/24/23 188 lb 3.2 oz (85.4 kg)  01/22/23 183 lb (83 kg)    General: Appears her stated age, obese in NAD. Cardiovascular: Normal rate and rhythm. S1,S2 noted.  No murmur, rubs or gallops noted.  Pulmonary/Chest: Normal effort and positive vesicular breath sounds. No  respiratory distress. No wheezes, rales or ronchi noted.  Musculoskeletal: No signs of joint swelling. No difficulty with gait.  Neurological: Alert and oriented. Coordination normal.  Psychiatric: Mood and affect normal. Behavior is normal. Judgment and thought content normal.   BMET    Component Value Date/Time   NA 139 02/25/2023 0808   K 4.6 02/25/2023 0808   CL 103 02/25/2023 0808   CO2 20 02/25/2023 0808   GLUCOSE 106 (H) 02/25/2023 0808   GLUCOSE 113 (H) 01/22/2023 1238  BUN 12 02/25/2023 0808   CREATININE 0.69 02/25/2023 0808   CALCIUM 9.1 02/25/2023 0808   GFRNONAA >60 01/22/2023 1238   GFRAA 132 04/18/2020 0926    Lipid Panel     Component Value Date/Time   CHOL 236 (H) 02/25/2023 0808   TRIG 125 02/25/2023 0808   HDL 40 02/25/2023 0808   CHOLHDL 5.9 (H) 02/25/2023 0808   CHOLHDL 4 04/30/2022 0831   VLDL 23.8 04/30/2022 0831   LDLCALC 173 (H) 02/25/2023 0808    CBC    Component Value Date/Time   WBC 5.9 02/25/2023 0808   WBC 11.2 (H) 01/22/2023 1222   RBC 4.47 02/25/2023 0808   RBC 4.57 01/22/2023 1222   HGB 13.3 02/25/2023 0808   HCT 38.2 02/25/2023 0808   PLT 305 02/25/2023 0808   MCV 86 02/25/2023 0808   MCV 83 11/12/2013 1927   MCH 29.8 02/25/2023 0808   MCH 29.3 01/22/2023 1222   MCHC 34.8 02/25/2023 0808   MCHC 35.6 01/22/2023 1222   RDW 13.1 02/25/2023 0808   RDW 14.0 11/12/2013 1927   LYMPHSABS 1.9 02/25/2023 0808   LYMPHSABS 1.5 09/22/2013 0202   MONOABS 0.7 01/22/2023 1222   MONOABS 0.7 09/22/2013 0202   EOSABS 0.2 02/25/2023 0808   EOSABS 0.1 09/22/2013 0202   BASOSABS 0.0 02/25/2023 0808   BASOSABS 0.0 09/22/2013 0202    Hgb A1C Lab Results  Component Value Date   HGBA1C 5.6 02/25/2023     Assessment and Plan:    RTC in 6 months for your annual exam Nicki Reaper, NP

## 2023-05-08 NOTE — Assessment & Plan Note (Signed)
Encourage diet and exercise for weight loss We will try zepbound

## 2023-05-08 NOTE — Patient Instructions (Signed)
Calorie Counting for Weight Loss Calories are units of energy. Your body needs a certain number of calories from food to keep going throughout the day. When you eat or drink more calories than your body needs, your body stores the extra calories mostly as fat. When you eat or drink fewer calories than your body needs, your body burns fat to get the energy it needs. Calorie counting means keeping track of how many calories you eat and drink each day. Calorie counting can be helpful if you need to lose weight. If you eat fewer calories than your body needs, you should lose weight. Ask your health care provider what a healthy weight is for you. For calorie counting to work, you will need to eat the right number of calories each day to lose a healthy amount of weight per week. A dietitian can help you figure out how many calories you need in a day and will suggest ways to reach your calorie goal. A healthy amount of weight to lose each week is usually 1-2 lb (0.5-0.9 kg). This usually means that your daily calorie intake should be reduced by 500-750 calories. Eating 1,200-1,500 calories a day can help most women lose weight. Eating 1,500-1,800 calories a day can help most men lose weight. What do I need to know about calorie counting? Work with your health care provider or dietitian to determine how many calories you should get each day. To meet your daily calorie goal, you will need to: Find out how many calories are in each food that you would like to eat. Try to do this before you eat. Decide how much of the food you plan to eat. Keep a food log. Do this by writing down what you ate and how many calories it had. To successfully lose weight, it is important to balance calorie counting with a healthy lifestyle that includes regular activity. Where do I find calorie information?  The number of calories in a food can be found on a Nutrition Facts label. If a food does not have a Nutrition Facts label, try  to look up the calories online or ask your dietitian for help. Remember that calories are listed per serving. If you choose to have more than one serving of a food, you will have to multiply the calories per serving by the number of servings you plan to eat. For example, the label on a package of bread might say that a serving size is 1 slice and that there are 90 calories in a serving. If you eat 1 slice, you will have eaten 90 calories. If you eat 2 slices, you will have eaten 180 calories. How do I keep a food log? After each time that you eat, record the following in your food log as soon as possible: What you ate. Be sure to include toppings, sauces, and other extras on the food. How much you ate. This can be measured in cups, ounces, or number of items. How many calories were in each food and drink. The total number of calories in the food you ate. Keep your food log near you, such as in a pocket-sized notebook or on an app or website on your mobile phone. Some programs will calculate calories for you and show you how many calories you have left to meet your daily goal. What are some portion-control tips? Know how many calories are in a serving. This will help you know how many servings you can have of a certain   food. Use a measuring cup to measure serving sizes. You could also try weighing out portions on a kitchen scale. With time, you will be able to estimate serving sizes for some foods. Take time to put servings of different foods on your favorite plates or in your favorite bowls and cups so you know what a serving looks like. Try not to eat straight from a food's packaging, such as from a bag or box. Eating straight from the package makes it hard to see how much you are eating and can lead to overeating. Put the amount you would like to eat in a cup or on a plate to make sure you are eating the right portion. Use smaller plates, glasses, and bowls for smaller portions and to prevent  overeating. Try not to multitask. For example, avoid watching TV or using your computer while eating. If it is time to eat, sit down at a table and enjoy your food. This will help you recognize when you are full. It will also help you be more mindful of what and how much you are eating. What are tips for following this plan? Reading food labels Check the calorie count compared with the serving size. The serving size may be smaller than what you are used to eating. Check the source of the calories. Try to choose foods that are high in protein, fiber, and vitamins, and low in saturated fat, trans fat, and sodium. Shopping Read nutrition labels while you shop. This will help you make healthy decisions about which foods to buy. Pay attention to nutrition labels for low-fat or fat-free foods. These foods sometimes have the same number of calories or more calories than the full-fat versions. They also often have added sugar, starch, or salt to make up for flavor that was removed with the fat. Make a grocery list of lower-calorie foods and stick to it. Cooking Try to cook your favorite foods in a healthier way. For example, try baking instead of frying. Use low-fat dairy products. Meal planning Use more fruits and vegetables. One-half of your plate should be fruits and vegetables. Include lean proteins, such as chicken, turkey, and fish. Lifestyle Each week, aim to do one of the following: 150 minutes of moderate exercise, such as walking. 75 minutes of vigorous exercise, such as running. General information Know how many calories are in the foods you eat most often. This will help you calculate calorie counts faster. Find a way of tracking calories that works for you. Get creative. Try different apps or programs if writing down calories does not work for you. What foods should I eat?  Eat nutritious foods. It is better to have a nutritious, high-calorie food, such as an avocado, than a food with  few nutrients, such as a bag of potato chips. Use your calories on foods and drinks that will fill you up and will not leave you hungry soon after eating. Examples of foods that fill you up are nuts and nut butters, vegetables, lean proteins, and high-fiber foods such as whole grains. High-fiber foods are foods with more than 5 g of fiber per serving. Pay attention to calories in drinks. Low-calorie drinks include water and unsweetened drinks. The items listed above may not be a complete list of foods and beverages you can eat. Contact a dietitian for more information. What foods should I limit? Limit foods or drinks that are not good sources of vitamins, minerals, or protein or that are high in unhealthy fats. These   include: Candy. Other sweets. Sodas, specialty coffee drinks, alcohol, and juice. The items listed above may not be a complete list of foods and beverages you should avoid. Contact a dietitian for more information. How do I count calories when eating out? Pay attention to portions. Often, portions are much larger when eating out. Try these tips to keep portions smaller: Consider sharing a meal instead of getting your own. If you get your own meal, eat only half of it. Before you start eating, ask for a container and put half of your meal into it. When available, consider ordering smaller portions from the menu instead of full portions. Pay attention to your food and drink choices. Knowing the way food is cooked and what is included with the meal can help you eat fewer calories. If calories are listed on the menu, choose the lower-calorie options. Choose dishes that include vegetables, fruits, whole grains, low-fat dairy products, and lean proteins. Choose items that are boiled, broiled, grilled, or steamed. Avoid items that are buttered, battered, fried, or served with cream sauce. Items labeled as crispy are usually fried, unless stated otherwise. Choose water, low-fat milk,  unsweetened iced tea, or other drinks without added sugar. If you want an alcoholic beverage, choose a lower-calorie option, such as a glass of wine or light beer. Ask for dressings, sauces, and syrups on the side. These are usually high in calories, so you should limit the amount you eat. If you want a salad, choose a garden salad and ask for grilled meats. Avoid extra toppings such as bacon, cheese, or fried items. Ask for the dressing on the side, or ask for olive oil and vinegar or lemon to use as dressing. Estimate how many servings of a food you are given. Knowing serving sizes will help you be aware of how much food you are eating at restaurants. Where to find more information Centers for Disease Control and Prevention: www.cdc.gov U.S. Department of Agriculture: myplate.gov Summary Calorie counting means keeping track of how many calories you eat and drink each day. If you eat fewer calories than your body needs, you should lose weight. A healthy amount of weight to lose per week is usually 1-2 lb (0.5-0.9 kg). This usually means reducing your daily calorie intake by 500-750 calories. The number of calories in a food can be found on a Nutrition Facts label. If a food does not have a Nutrition Facts label, try to look up the calories online or ask your dietitian for help. Use smaller plates, glasses, and bowls for smaller portions and to prevent overeating. Use your calories on foods and drinks that will fill you up and not leave you hungry shortly after a meal. This information is not intended to replace advice given to you by your health care provider. Make sure you discuss any questions you have with your health care provider. Document Revised: 11/11/2019 Document Reviewed: 11/11/2019 Elsevier Patient Education  2023 Elsevier Inc.  

## 2023-05-08 NOTE — Assessment & Plan Note (Signed)
Continue clindamycin per dermatology

## 2023-05-12 ENCOUNTER — Encounter: Payer: Self-pay | Admitting: Internal Medicine

## 2023-05-16 MED ORDER — PHENTERMINE HCL 15 MG PO CAPS: 15.0000 mg | ORAL_CAPSULE | ORAL | 0 refills | Status: DC

## 2023-05-16 NOTE — Addendum Note (Signed)
Addended by: Lorre Munroe on: 05/16/2023 09:25 AM   Modules accepted: Orders

## 2023-05-19 ENCOUNTER — Telehealth: Payer: BC Managed Care – PPO | Admitting: Dietician

## 2023-06-17 ENCOUNTER — Other Ambulatory Visit: Payer: Self-pay | Admitting: Internal Medicine

## 2023-06-19 ENCOUNTER — Encounter: Payer: Self-pay | Admitting: Internal Medicine

## 2023-06-19 NOTE — Telephone Encounter (Signed)
Requested medication (s) are due for refill today: yes  Requested medication (s) are on the active medication list: yes  Last refill:  05/16/23  #30  Future visit scheduled: yes  Notes to clinic:  med not delegated to NT to RF   Requested Prescriptions  Pending Prescriptions Disp Refills   phentermine 15 MG capsule [Pharmacy Med Name: PHENTERMINE 15 MG CAPSULE] 30 capsule 0    Sig: TAKE 1 CAPSULE BY MOUTH EVERY DAY IN THE MORNING     Not Delegated - Neurology: Anticonvulsants - Controlled - phentermine hydrochloride Failed - 06/17/2023  7:58 PM      Failed - This refill cannot be delegated      Passed - eGFR in normal range and within 360 days    GFR calc Af Amer  Date Value Ref Range Status  04/18/2020 132 >59 mL/min/1.73 Final    Comment:    **[facility] currently reports eGFR in compliance with the current**   recommendations of the [employer]. [facility] will   update reporting as new guidelines are published from the NKF-ASN   Task force.    GFR, Estimated  Date Value Ref Range Status  01/22/2023 >60 >60 mL/min Final    Comment:    (NOTE) Calculated using the CKD-EPI Creatinine Equation (2021)    GFR  Date Value Ref Range Status  04/30/2022 112.34 >60.00 mL/min Final    Comment:    Calculated using the CKD-EPI Creatinine Equation (2021)   eGFR  Date Value Ref Range Status  02/25/2023 115 >59 mL/min/1.73 Final         Passed - Cr in normal range and within 360 days    Creatinine, Ser  Date Value Ref Range Status  02/25/2023 0.69 0.57 - 1.00 mg/dL Final         Passed - Last BP in normal range    BP Readings from Last 1 Encounters:  05/08/23 100/62         Passed - Valid encounter within last 6 months    Recent Outpatient Visits           1 month ago Seasonal asthma   [facility] [place], Salvadore Oxford, NP       Future Appointments             In 4 months Baity, Salvadore Oxford, NP  [facility],  [facility]            Passed - Weight completed in the last 3 months    Wt Readings from Last 1 Encounters:  05/08/23 192 lb 12.8 oz (87.5 kg)

## 2023-06-23 ENCOUNTER — Other Ambulatory Visit: Payer: Self-pay | Admitting: Internal Medicine

## 2023-06-23 MED ORDER — PHENTERMINE HCL 15 MG PO CAPS: 15.0000 mg | ORAL_CAPSULE | Freq: Every day | ORAL | 0 refills | Status: DC

## 2023-06-23 NOTE — Telephone Encounter (Signed)
The RX printed instead of going to the pharmacy.    Thanks,   -Vernona Rieger

## 2023-06-24 NOTE — Telephone Encounter (Signed)
Requested medication (s) are due for refill today: No  Requested medication (s) are on the active medication list: yes  Last refill: 06/19/23  #30  0 refills  Future visit scheduled yes 05/11/24  Notes to clinic: Not delegated, cannot refuse non-delegated meds per protocol. Already responded to.  Requested Prescriptions  Pending Prescriptions Disp Refills   phentermine 15 MG capsule [Pharmacy Med Name: PHENTERMINE 15 MG CAPSULE] 30 capsule 0    Sig: TAKE 1 CAPSULE BY MOUTH EVERY DAY IN THE MORNING     Not Delegated - Neurology: Anticonvulsants - Controlled - phentermine hydrochloride Failed - 06/23/2023  9:13 AM      Failed - This refill cannot be delegated      Passed - eGFR in normal range and within 360 days    GFR calc Af Amer  Date Value Ref Range Status  04/18/2020 132 >59 mL/min/1.73 Final    Comment:    **[facility] currently reports eGFR in compliance with the current**   recommendations of the [employer]. [facility] will   update reporting as new guidelines are published from the NKF-ASN   Task force.    GFR, Estimated  Date Value Ref Range Status  01/22/2023 >60 >60 mL/min Final    Comment:    (NOTE) Calculated using the CKD-EPI Creatinine Equation (2021)    GFR  Date Value Ref Range Status  04/30/2022 112.34 >60.00 mL/min Final    Comment:    Calculated using the CKD-EPI Creatinine Equation (2021)   eGFR  Date Value Ref Range Status  02/25/2023 115 >59 mL/min/1.73 Final         Passed - Cr in normal range and within 360 days    Creatinine, Ser  Date Value Ref Range Status  02/25/2023 0.69 0.57 - 1.00 mg/dL Final         Passed - Last BP in normal range    BP Readings from Last 1 Encounters:  05/08/23 100/62         Passed - Valid encounter within last 6 months    Recent Outpatient Visits           1 month ago Seasonal asthma   [facility] [place], Salvadore Oxford, NP       Future Appointments             In 4  months Baity, Salvadore Oxford, NP [facility]            Passed - Weight completed in the last 3 months    Wt Readings from Last 1 Encounters:  05/08/23 192 lb 12.8 oz (87.5 kg)

## 2023-07-02 ENCOUNTER — Encounter: Payer: Self-pay | Admitting: Internal Medicine

## 2023-07-02 DIAGNOSIS — J309 Allergic rhinitis, unspecified: Secondary | ICD-10-CM

## 2023-07-03 MED ORDER — LEVOCETIRIZINE DIHYDROCHLORIDE 5 MG PO TABS
5.0000 mg | ORAL_TABLET | Freq: Every day | ORAL | 1 refills | Status: DC
Start: 2023-07-03 — End: 2024-01-06

## 2023-07-03 MED ORDER — MONTELUKAST SODIUM 10 MG PO TABS
ORAL_TABLET | ORAL | 1 refills | Status: DC
Start: 2023-07-03 — End: 2023-11-13

## 2023-07-07 ENCOUNTER — Telehealth: Payer: BC Managed Care – PPO | Admitting: Family Medicine

## 2023-07-07 DIAGNOSIS — H6690 Otitis media, unspecified, unspecified ear: Secondary | ICD-10-CM

## 2023-07-07 MED ORDER — AMOXICILLIN-POT CLAVULANATE 875-125 MG PO TABS
1.0000 | ORAL_TABLET | Freq: Two times a day (BID) | ORAL | 0 refills | Status: AC
Start: 2023-07-07 — End: 2023-07-17

## 2023-07-07 NOTE — Progress Notes (Signed)

## 2023-08-11 ENCOUNTER — Telehealth: Payer: BC Managed Care – PPO | Admitting: Physician Assistant

## 2023-08-11 DIAGNOSIS — B9689 Other specified bacterial agents as the cause of diseases classified elsewhere: Secondary | ICD-10-CM

## 2023-08-11 DIAGNOSIS — J069 Acute upper respiratory infection, unspecified: Secondary | ICD-10-CM

## 2023-08-11 MED ORDER — AZITHROMYCIN 250 MG PO TABS: ORAL_TABLET | ORAL | 0 refills | Status: AC

## 2023-08-11 NOTE — Progress Notes (Signed)

## 2023-08-25 ENCOUNTER — Telehealth: Payer: BC Managed Care – PPO | Admitting: Emergency Medicine

## 2023-08-25 DIAGNOSIS — J029 Acute pharyngitis, unspecified: Secondary | ICD-10-CM

## 2023-08-25 NOTE — Progress Notes (Signed)
Because you were recently on antibiotics, I feel your condition is more complicated than can be managed by Evisit and warrants further evaluation. I recommend that you be seen in a face to face visit.   NOTE: There will be NO CHARGE for this eVisit   If you are having a true medical emergency please call 911.      For an urgent face to face visit, [place] has eight urgent care centers for your convenience:   NEW!! [facility] Health Urgent Care Center at [facility] Get Driving Directions [phone] [address] [place], [address]    [facility] Health Urgent Care Center at [facility] Get Driving Directions [phone] [address] [place], [place] [address]   [facility] Health Urgent Care Center [facility]) Get Driving Directions [phone] [address] [place], [place] [address]  [facility] Health Urgent Care Center [facility]) Get Driving Directions [phone] [address] [place],  [place]  [address]  [facility] Health Urgent Care Center [facility] at [employer]  [phone] [address] [place],  [place] [address]   [facility] Health Urgent Care at [facility] Get Driving Directions [phone] [address] [place], [place] [address]   [facility] Health Urgent Care at [facility] Get Driving Directions  [phone] [address] [place], [place] [address]   [facility] Health Urgent Care at [facility] Directions [phone] [address] [place], [place] [address]  Your MyChart E-visit questionnaire answers were reviewed by a board certified advanced clinical practitioner to complete your personal care plan based on your specific symptoms.  Thank you for using e-Visits.

## 2023-11-12 ENCOUNTER — Other Ambulatory Visit: Payer: Self-pay | Admitting: Internal Medicine

## 2023-11-12 ENCOUNTER — Other Ambulatory Visit (HOSPITAL_COMMUNITY)
Admission: RE | Admit: 2023-11-12 | Discharge: 2023-11-12 | Disposition: A | Discharge status: Home / Self Care | Payer: BC Managed Care – PPO | Source: Ambulatory Visit | Attending: Internal Medicine | Admitting: Internal Medicine

## 2023-11-12 ENCOUNTER — Ambulatory Visit (INDEPENDENT_AMBULATORY_CARE_PROVIDER_SITE_OTHER): Payer: BC Managed Care – PPO | Admitting: Internal Medicine

## 2023-11-12 ENCOUNTER — Encounter: Payer: Self-pay | Admitting: Internal Medicine

## 2023-11-12 VITALS — BP 108/68 | Ht 66.0 in | Wt 178.8 lb

## 2023-11-12 DIAGNOSIS — Z124 Encounter for screening for malignant neoplasm of cervix: Secondary | ICD-10-CM | POA: Diagnosis not present

## 2023-11-12 DIAGNOSIS — Z136 Encounter for screening for cardiovascular disorders: Secondary | ICD-10-CM | POA: Diagnosis not present

## 2023-11-12 DIAGNOSIS — E663 Overweight: Secondary | ICD-10-CM

## 2023-11-12 DIAGNOSIS — Z113 Encounter for screening for infections with a predominantly sexual mode of transmission: Secondary | ICD-10-CM | POA: Diagnosis not present

## 2023-11-12 DIAGNOSIS — R739 Hyperglycemia, unspecified: Secondary | ICD-10-CM

## 2023-11-12 DIAGNOSIS — Z0001 Encounter for general adult medical examination with abnormal findings: Secondary | ICD-10-CM

## 2023-11-12 DIAGNOSIS — J309 Allergic rhinitis, unspecified: Secondary | ICD-10-CM

## 2023-11-12 DIAGNOSIS — Z6828 Body mass index (BMI) 28.0-28.9, adult: Secondary | ICD-10-CM

## 2023-11-12 NOTE — Assessment & Plan Note (Signed)
Encourage diet and exercise for weight loss

## 2023-11-12 NOTE — Progress Notes (Signed)
Subjective:    Patient ID: Carolyn Robles, female    DOB: 05-27-1986, 38 y.o.   MRN: [id number]  HPI  Patient presents to clinic today for her annual exam.  Flu: 06/2019 Tetanus: 02/2007 COVID: never Pap smear: 04/2021 Dentist: biannually  Diet: She does eat meat. She consumes fruits and veggies. She tries to avoid fried foods. She drinks mostly carbonated water, water, orange juice. Exercise: None  Review of Systems   Past Medical History:  Diagnosis Date   Allergy    pollen, trees, etc    Anxiety 04/30/2022   Asthma    seasonal   C. difficile diarrhea    Confusion 08/24/2019   COVID-19    05/08/20 trouble taste and smell   FH: heart attack 04/14/2020   History of colposcopy    CIN 3 HPV +   Tick bite of left hip    03/2020   UTI (urinary tract infection)     Current Outpatient Medications  Medication Sig Dispense Refill   albuterol (VENTOLIN HFA) 108 (90 Base) MCG/ACT inhaler INHALE 1 TO 2 PUFFS INTO THE LUNGS EVERY 6 (SIX) HOURS AS NEEDED FOR WHEEZING OR SHORTNESS OF BREATH. 18 g 12   clindamycin (CLINDAGEL) 1 % gel Apply topically 2 (two) times daily. 30 g 11   clobetasol ointment (TEMOVATE) 0.05 % Apply 1 Application topically 2 (two) times daily. 30 g 1   Cyanocobalamin (VITAMIN B12 PO) Take by mouth.     cyclobenzaprine (FLEXERIL) 5 MG tablet Take 1 tablet (5 mg total) by mouth 3 (three) times daily as needed for muscle spasms. 30 tablet 2   Dapsone (ACZONE) 7.5 % GEL Apply a small amount to affected areas on the skin every day in the morning. 60 g 0   fluticasone (FLONASE) 50 MCG/ACT nasal spray Place 2 sprays into both nostrils daily as needed for allergies or rhinitis. 16 g 11   levocetirizine (XYZAL) 5 MG tablet Take 1 tablet (5 mg total) by mouth daily. 90 tablet 1   meloxicam (MOBIC) 7.5 MG tablet Take 1 tablet (7.5 mg total) by mouth daily as needed for pain. 90 tablet 0   montelukast (SINGULAIR) 10 MG tablet TAKE 1 TABLET BY MOUTH EVERYDAY AT BEDTIME 90  tablet 1   phentermine 15 MG capsule Take 1 capsule (15 mg total) by mouth daily. 30 capsule 0   tirzepatide (ZEPBOUND) 2.5 MG/0.5ML Pen Inject 2.5 mg into the skin once a week. 2 mL 0   tretinoin (RETIN-A) 0.025 % cream Apply topically at bedtime. With moisturizer 45 g 11   No current facility-administered medications for this visit.    Allergies  Allergen Reactions   Apple    Apple Juice     Or fresh fruit throat itching    Doxycycline    Spironolactone Photosensitivity    Family History  Problem Relation Age of Onset   Heart disease Mother        MI died when pt was young; mom died when was 83 and mom was adopted    Diabetes Father    Depression Sister    Miscarriages / [place] Sister    Alcohol abuse Brother    Depression Brother    Drug abuse Brother     Social History   Socioeconomic History   Marital status: Married    Spouse name: Not on file   Number of children: Not on file   Years of education: Not on file   Highest education level:  Not on file  Occupational History   Not on file  Tobacco Use   Smoking status: Never   Smokeless tobacco: Never  Vaping Use   Vaping status: Never Used  Substance and Sexual Activity   Alcohol use: Yes    Alcohol/week: 2.0 standard drinks of alcohol    Types: 2 Cans of beer per week   Drug use: No   Sexual activity: Yes    Birth control/protection: None  Other Topics Concern   Not on file  Social History Narrative   College ed    Receptionist at [employer]    Married    2 kids    Grew up in [place] with siblings   Social Drivers of Corporate investment banker Strain: Not on file  Food Insecurity: Not on file  Transportation Needs: Not on file  Physical Activity: Not on file  Stress: Not on file  Social Connections: Not on file  Intimate Partner Violence: Not on file     Constitutional: Denies fever, malaise, fatigue, headache or abrupt weight changes.  HEENT: Denies eye pain, eye redness, ear pain, ringing  in the ears, wax buildup, runny nose, nasal congestion, bloody nose, or sore throat. Respiratory: Denies difficulty breathing, shortness of breath, cough or sputum production.   Cardiovascular: Denies chest pain, chest tightness, palpitations or swelling in the hands or feet.  Gastrointestinal: Denies abdominal pain, bloating, constipation, diarrhea or blood in the stool.  GU: Denies urgency, frequency, pain with urination, burning sensation, blood in urine, odor or discharge. Musculoskeletal: Patient reports joint pain.  Denies decrease in range of motion, difficulty with gait, muscle pain or joint swelling.  Skin: Denies redness, rashes, lesions or ulcercations.  Neurological: Denies dizziness, difficulty with memory, difficulty with speech or problems with balance and coordination.  Psych: Patient has a history of anxiety.  Denies depression, SI/HI.  No other specific complaints in a complete review of systems (except as listed in HPI above).      Objective:   Physical Exam  BP 108/68 (BP Location: Left Arm, Patient Position: Sitting, Cuff Size: Normal)   Ht 5\' 6"  (1.676 m)   Wt 178 lb 12.8 oz (81.1 kg)   LMP 10/30/2023 (Approximate)   BMI 28.86 kg/m   Wt Readings from Last 3 Encounters:  05/08/23 192 lb 12.8 oz (87.5 kg)  02/24/23 188 lb 3.2 oz (85.4 kg)  01/22/23 183 lb (83 kg)    General: Appears her stated age, overweight, in NAD. Skin: Warm, dry and intact. No rashes, lesions or ulcerations noted. HEENT: Head: normal shape and size; Eyes: sclera white, no icterus, conjunctiva pink, PERRLA and EOMs intact;   Neck:  Neck supple, trachea midline. No masses, lumps or thyromegaly present.  Cardiovascular: Normal rate and rhythm. S1,S2 noted.  No murmur, rubs or gallops noted. No JVD or BLE edema.  Pulmonary/Chest: Normal effort and positive vesicular breath sounds. No respiratory distress. No wheezes, rales or ronchi noted.  Abdomen: Soft and nontender. Normal bowel sounds. No  distention or masses noted. Pelvic: Normal female anatomy. Cervix not well visualized. No CMT. Adnexa non palpable. Musculoskeletal: Strength 5/5 BUE/BLE. No difficulty with gait.  Neurological: Alert and oriented. Cranial nerves II-XII grossly intact. Coordination normal.  Psychiatric: Mood and affect normal. Behavior is normal. Judgment and thought content normal.    BMET    Component Value Date/Time   NA 139 02/25/2023 0808   K 4.6 02/25/2023 0808   CL 103 02/25/2023 0808   CO2 20  02/25/2023 0808   GLUCOSE 106 (H) 02/25/2023 0808   GLUCOSE 113 (H) 01/22/2023 1238   BUN 12 02/25/2023 0808   CREATININE 0.69 02/25/2023 0808   CALCIUM 9.1 02/25/2023 0808   GFRNONAA >60 01/22/2023 1238   GFRAA 132 04/18/2020 0926    Lipid Panel     Component Value Date/Time   CHOL 236 (H) 02/25/2023 0808   TRIG 125 02/25/2023 0808   HDL 40 02/25/2023 0808   CHOLHDL 5.9 (H) 02/25/2023 0808   CHOLHDL 4 04/30/2022 0831   VLDL 23.8 04/30/2022 0831   LDLCALC 173 (H) 02/25/2023 0808    CBC    Component Value Date/Time   WBC 5.9 02/25/2023 0808   WBC 11.2 (H) 01/22/2023 1222   RBC 4.47 02/25/2023 0808   RBC 4.57 01/22/2023 1222   HGB 13.3 02/25/2023 0808   HCT 38.2 02/25/2023 0808   PLT 305 02/25/2023 0808   MCV 86 02/25/2023 0808   MCV 83 11/12/2013 1927   MCH 29.8 02/25/2023 0808   MCH 29.3 01/22/2023 1222   MCHC 34.8 02/25/2023 0808   MCHC 35.6 01/22/2023 1222   RDW 13.1 02/25/2023 0808   RDW 14.0 11/12/2013 1927   LYMPHSABS 1.9 02/25/2023 0808   LYMPHSABS 1.5 09/22/2013 0202   MONOABS 0.7 01/22/2023 1222   MONOABS 0.7 09/22/2013 0202   EOSABS 0.2 02/25/2023 0808   EOSABS 0.1 09/22/2013 0202   BASOSABS 0.0 02/25/2023 0808   BASOSABS 0.0 09/22/2013 0202    Hgb A1C Lab Results  Component Value Date   HGBA1C 5.6 02/25/2023            Assessment & Plan:   Preventative health maintenance:  Flu shot declined Tetanus declined Encouraged her to get her  COVID-vaccine Pap smear today with STD screening Encouraged her to consume a balanced diet and exercise regimen Advised her to see a dentist annually We will check CBC, c-Met, lipid, A1c today  RTC in 6 months, follow-up chronic conditions Nicki Reaper, NP

## 2023-11-12 NOTE — Patient Instructions (Signed)

## 2023-11-13 ENCOUNTER — Encounter: Payer: Self-pay | Admitting: Internal Medicine

## 2023-11-13 LAB — HEMOGLOBIN A1C
Hgb A1c MFr Bld: 5.4 %{Hb} (ref <5.7)
Mean Plasma Glucose: 108 mg/dL
eAG (mmol/L): 6 mmol/L

## 2023-11-13 LAB — COMPLETE METABOLIC PANEL WITH GFR
AG Ratio: 1.8 (calc) (ref 1.0–2.5)
ALT: 15 U/L (ref 6–29)
AST: 16 U/L (ref 10–30)
Albumin: 4.3 g/dL (ref 3.6–5.1)
Alkaline phosphatase (APISO): 42 U/L (ref 31–125)
BUN: 8 mg/dL (ref 7–25)
CO2: 29 mmol/L (ref 20–32)
Calcium: 9 mg/dL (ref 8.6–10.2)
Chloride: 104 mmol/L (ref 98–110)
Creat: 0.76 mg/dL (ref 0.50–0.97)
Globulin: 2.4 g/dL (ref 1.9–3.7)
Glucose, Bld: 85 mg/dL (ref 65–99)
Potassium: 4.3 mmol/L (ref 3.5–5.3)
Sodium: 139 mmol/L (ref 135–146)
Total Bilirubin: 0.5 mg/dL (ref 0.2–1.2)
Total Protein: 6.7 g/dL (ref 6.1–8.1)
eGFR: 103 mL/min/{1.73_m2} (ref >=60)

## 2023-11-13 LAB — CBC
HCT: 37.6 % (ref 35.0–45.0)
Hemoglobin: 12.5 g/dL (ref 11.7–15.5)
MCH: 28.9 pg (ref 27.0–33.0)
MCHC: 33.2 g/dL (ref 32.0–36.0)
MCV: 87 fL (ref 80.0–100.0)
MPV: 10.5 fL (ref 7.5–12.5)
Platelets: 244 10*3/uL (ref 140–400)
RBC: 4.32 10*6/uL (ref 3.80–5.10)
RDW: 12.7 % (ref 11.0–15.0)
WBC: 5.4 10*3/uL (ref 3.8–10.8)

## 2023-11-13 LAB — LIPID PANEL
Cholesterol: 165 mg/dL (ref <200)
HDL: 33 mg/dL — ABNORMAL LOW (ref >=50)
LDL Cholesterol (Calc): 112 mg/dL — ABNORMAL HIGH
Non-HDL Cholesterol (Calc): 132 mg/dL — ABNORMAL HIGH (ref <130)
Total CHOL/HDL Ratio: 5 (calc) — ABNORMAL HIGH (ref <5.0)
Triglycerides: 94 mg/dL (ref <150)

## 2023-11-13 NOTE — Telephone Encounter (Signed)
Requested Prescriptions  Pending Prescriptions Disp Refills   montelukast (SINGULAIR) 10 MG tablet [Pharmacy Med Name: MONTELUKAST SOD 10 MG TABLET] 90 tablet 1    Sig: TAKE 1 TABLET BY MOUTH EVERYDAY AT BEDTIME     Pulmonology:  Leukotriene Inhibitors Passed - 11/13/2023  3:03 PM      Passed - Valid encounter within last 12 months    Recent Outpatient Visits           Yesterday Encounter for general adult medical examination with abnormal findings   [facility] [place], Salvadore Oxford, NP   6 months ago Seasonal asthma   [place] [facility] [place], Salvadore Oxford, NP       Future Appointments             In 6 months Baity, Salvadore Oxford, NP [facility], [facility]

## 2023-11-14 LAB — CYTOLOGY - PAP
Chlamydia: NEGATIVE
Comment: NEGATIVE
Comment: NEGATIVE
Comment: NEGATIVE
Comment: NORMAL
Diagnosis: NEGATIVE
High risk HPV: NEGATIVE
Neisseria Gonorrhea: NEGATIVE
Trichomonas: NEGATIVE

## 2023-11-22 ENCOUNTER — Telehealth: Payer: BC Managed Care – PPO | Admitting: Nurse Practitioner

## 2023-11-22 DIAGNOSIS — J020 Streptococcal pharyngitis: Secondary | ICD-10-CM

## 2023-11-22 MED ORDER — AMOXICILLIN 500 MG PO CAPS: 500.0000 mg | ORAL_CAPSULE | Freq: Two times a day (BID) | ORAL | 0 refills | Status: AC

## 2023-11-22 NOTE — Progress Notes (Signed)
 I have spent 5 minutes in review of e-visit questionnaire, review and updating patient chart, medical decision making and response to patient.   Claiborne Rigg, NP

## 2023-11-22 NOTE — Progress Notes (Signed)

## 2023-12-01 ENCOUNTER — Encounter: Payer: Self-pay | Admitting: Internal Medicine

## 2024-01-04 ENCOUNTER — Other Ambulatory Visit: Payer: Self-pay | Admitting: Internal Medicine

## 2024-01-04 DIAGNOSIS — J309 Allergic rhinitis, unspecified: Secondary | ICD-10-CM

## 2024-01-05 ENCOUNTER — Encounter: Payer: Self-pay | Admitting: Internal Medicine

## 2024-01-06 NOTE — Telephone Encounter (Signed)
 Requested Prescriptions  Pending Prescriptions Disp Refills   levocetirizine (XYZAL) 5 MG tablet [Pharmacy Med Name: LEVOCETIRIZINE 5 MG TABLET] 90 tablet 1    Sig: TAKE 1 TABLET (5 MG TOTAL) BY MOUTH DAILY.     Ear, Nose, and Throat:  Antihistamines - levocetirizine dihydrochloride Passed - 01/06/2024  8:34 AM      Passed - Cr in normal range and within 360 days    Creat  Date Value Ref Range Status  11/12/2023 0.76 0.50 - 0.97 mg/dL Final         Passed - eGFR is 10 or above and within 360 days    GFR calc Af Amer  Date Value Ref Range Status  04/18/2020 132 >59 mL/min/1.73 Final    Comment:    **[facility] currently reports eGFR in compliance with the current**   recommendations of the [employer]. [facility] will   update reporting as new guidelines are published from the NKF-ASN   Task force.    GFR, Estimated  Date Value Ref Range Status  01/22/2023 >60 >60 mL/min Final    Comment:    (NOTE) Calculated using the CKD-EPI Creatinine Equation (2021)    GFR  Date Value Ref Range Status  04/30/2022 112.34 >60.00 mL/min Final    Comment:    Calculated using the CKD-EPI Creatinine Equation (2021)   eGFR  Date Value Ref Range Status  11/12/2023 103 > OR = 60 mL/min/1.64m2 Final  02/25/2023 115 >59 mL/min/1.73 Final         Passed - Valid encounter within last 12 months    Recent Outpatient Visits           1 month ago Encounter for general adult medical examination with abnormal findings   [facility], Salvadore Oxford, NP   8 months ago Seasonal asthma   [place] [facility] [place], Salvadore Oxford, NP       Future Appointments             In 4 months Baity, Salvadore Oxford, NP [facility] [place], [facility]

## 2024-02-04 ENCOUNTER — Encounter: Payer: Self-pay | Admitting: Internal Medicine

## 2024-02-19 ENCOUNTER — Ambulatory Visit: Admitting: Internal Medicine

## 2024-02-19 ENCOUNTER — Encounter: Payer: Self-pay | Admitting: Internal Medicine

## 2024-02-19 VITALS — BP 102/64 | Ht 66.0 in | Wt 154.4 lb

## 2024-02-19 DIAGNOSIS — L739 Follicular disorder, unspecified: Secondary | ICD-10-CM | POA: Diagnosis not present

## 2024-02-19 MED ORDER — MUPIROCIN 2 % EX OINT: 1.0000 | TOPICAL_OINTMENT | Freq: Two times a day (BID) | CUTANEOUS | 0 refills | Status: DC

## 2024-02-19 NOTE — Patient Instructions (Signed)
Folliculitis  Folliculitis occurs when hair follicles become inflamed. A hair follicle is a tiny opening in your skin where your hair grows from. This condition often occurs on the scalp, thighs, legs, back, and buttocks but can happen anywhere on the body. What are the causes? A common cause of this condition is an infection from bacteria. The type of folliculitis caused by bacteria can last a long time or go away and come back. The bacteria can live anywhere on your skin. They are often found in the nostrils. Other causes may include: An infection from a fungus. An infection from a virus. Your skin touching some chemicals, such as oils and tars. Shaving or waxing. Greasy ointments or creams put on the skin. What increases the risk? You are more likely to develop this condition if: Your body has a weak disease-fighting system (immune system). You have diabetes. You are obese. What are the signs or symptoms? Symptoms of this condition include: Redness. Soreness. Swelling. Itching. Small white or yellow, itchy spots filled with pus (pustules) that appear over a red area. If the infection goes deep into the follicle, these may turn into a boil (furuncle). A group of boils (carbuncle). These tend to form in hairy, sweaty areas of the body. How is this diagnosed? This condition is diagnosed with a skin exam. Your health care provider may take a sample of one of the pustules or boils to test in a lab. How is this treated? This condition may be treated by: Putting a warm, wet cloth (warm compress) on the affected areas. Taking antibiotics or applying them to the skin. Applying or bathing with a solution that kills germs (antiseptic). Taking an over-the-counter medicine. This can help with itching. Having a procedure to drain pustules or boils. This may be done if a pustule or boil contains a lot of pus or fluid. Having laser hair removal. This may be done when the condition lasts for a  long time. Follow these instructions at home: Managing pain and swelling  If directed, apply heat to the affected area as often as told by your health care provider. Use the heat source that your health care provider recommends, such as a moist heat pack or a heating pad. Place a towel between your skin and the heat source. Leave the heat on for 20-30 minutes. If your skin turns bright red, remove the heat right away to prevent burns. The risk of burns is higher if you cannot feel pain, heat, or cold. General instructions Take over-the-counter and prescription medicines only as told by your health care provider. If you were prescribed antibiotics, take or apply them as told by your health care provider. Do not stop using the antibiotic even if you start to feel better. Check your irritated area every day for signs of infection. Check for: More redness, swelling, or pain. Fluid or blood. Warmth. Pus or a bad smell. Do not shave irritated skin. Keep all follow-up visits. Your health care provider will check if the treatments are helping. Contact a health care provider if: You have a fever. You have any signs of infection. Red streaks are spreading from the affected area. This information is not intended to replace advice given to you by your health care provider. Make sure you discuss any questions you have with your health care provider. Document Revised: 03/05/2022 Document Reviewed: 03/05/2022 Elsevier Patient Education  2024 [employer].

## 2024-02-19 NOTE — Progress Notes (Signed)
 Subjective:    Patient ID: Carolyn Robles, female    DOB: Jun 10, 1986, 38 y.o.   MRN: [id number]  HPI  Discussed the use of AI scribe software for clinical note transcription with the patient, who gave verbal consent to proceed.  Carolyn Robles is a 38 year old female who presents with a lump under her arm.  She noticed the lump under her arm yesterday. It feels like a 'little pea' and she suspects it might be a lymph node. It is not painful, has not drained, and is not visible, but she can palpate it.  She describes the lump as having a small head and believes it might be an ingrown hair or something similar. She sometimes dry shaves the area, which she acknowledges might have contributed to the issue. There has been no redness, swelling, or drainage from the lump.  Review of Systems   Past Medical History:  Diagnosis Date   Allergy    pollen, trees, etc    Anxiety 04/30/2022   Asthma    seasonal   C. difficile diarrhea    Confusion 08/24/2019   COVID-19    05/08/20 trouble taste and smell   FH: heart attack 04/14/2020   History of colposcopy    CIN 3 HPV +   Tick bite of left hip    03/2020   UTI (urinary tract infection)     Current Outpatient Medications  Medication Sig Dispense Refill   albuterol  (VENTOLIN  HFA) 108 (90 Base) MCG/ACT inhaler INHALE 1 TO 2 PUFFS INTO THE LUNGS EVERY 6 (SIX) HOURS AS NEEDED FOR WHEEZING OR SHORTNESS OF BREATH. 18 g 12   clindamycin  (CLINDAGEL ) 1 % gel Apply topically 2 (two) times daily. 30 g 11   clobetasol  ointment (TEMOVATE ) 0.05 % Apply 1 Application topically 2 (two) times daily. 30 g 1   Cyanocobalamin  (VITAMIN B12 PO) Take by mouth.     cyclobenzaprine  (FLEXERIL ) 5 MG tablet Take 1 tablet (5 mg total) by mouth 3 (three) times daily as needed for muscle spasms. 30 tablet 2   Dapsone  (ACZONE ) 7.5 % GEL Apply a small amount to affected areas on the skin every day in the morning. 60 g 0   fluticasone  (FLONASE ) 50 MCG/ACT nasal  spray Place 2 sprays into both nostrils daily as needed for allergies or rhinitis. 16 g 11   levocetirizine (XYZAL ) 5 MG tablet TAKE 1 TABLET (5 MG TOTAL) BY MOUTH DAILY. 90 tablet 1   meloxicam  (MOBIC ) 7.5 MG tablet Take 1 tablet (7.5 mg total) by mouth daily as needed for pain. 90 tablet 0   montelukast  (SINGULAIR ) 10 MG tablet TAKE 1 TABLET BY MOUTH EVERYDAY AT BEDTIME 90 tablet 1   tirzepatide  (MOUNJARO ) 5 MG/0.5ML Pen Inject 5 mg into the skin once a week.     tretinoin  (RETIN-A ) 0.025 % cream Apply topically at bedtime. With moisturizer 45 g 11   No current facility-administered medications for this visit.    Allergies  Allergen Reactions   Apple    Apple Juice     Or fresh fruit throat itching    Doxycycline     Spironolactone  Photosensitivity    Family History  Problem Relation Age of Onset   Heart disease Mother        MI died when pt was young; mom died when was 20 and mom was adopted    Diabetes Father    Depression Sister    Miscarriages / [place] Sister  Alcohol abuse Brother    Depression Brother    Drug abuse Brother     Social History   Socioeconomic History   Marital status: Married    Spouse name: Not on file   Number of children: Not on file   Years of education: Not on file   Highest education level: Not on file  Occupational History   Not on file  Tobacco Use   Smoking status: Never   Smokeless tobacco: Never  Vaping Use   Vaping status: Never Used  Substance and Sexual Activity   Alcohol use: Yes    Alcohol/week: 2.0 standard drinks of alcohol    Types: 2 Cans of beer per week   Drug use: No   Sexual activity: Yes    Birth control/protection: None  Other Topics Concern   Not on file  Social History Narrative   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  Respiratory: Denies difficulty breathing, shortness of breath, cough or sputum production.   Cardiovascular: Denies chest pain, chest tightness, palpitations or swelling in the hands or feet.  Skin: Pt reports lump of right axilla. Denies redness, rashes, or ulcercations.    No other specific complaints in a complete review of systems (except as listed in HPI above).      Objective:   Physical Exam  BP 102/64 (BP Location: Left Arm, Patient Position: Sitting, Cuff Size: Normal)   Ht 5\' 6"  (1.676 m)   Wt 154 lb 6.4 oz (70 kg)   LMP 01/26/2024 (Exact Date)   BMI 24.92 kg/m   Wt Readings from Last 3 Encounters:  11/12/23 178 lb 12.8 oz (81.1 kg)  05/08/23 192 lb 12.8 oz (87.5 kg)  02/24/23 188 lb 3.2 oz (85.4 kg)    General: Appears her stated age, well developed, well nourished in NAD. Skin: Warm, dry and intact. 3 mm papule noted of the medial right upper arm, close to the axilla. It has a small head but is not draining.  Cardiovascular: Normal rate. Pulmonary/Chest: Normal effort. Neurological: Alert and oriented.   BMET    Component Value Date/Time   NA 139 11/12/2023 0817   NA 139 02/25/2023 0808   K 4.3 11/12/2023 0817   CL 104 11/12/2023 0817   CO2 29 11/12/2023 0817   GLUCOSE 85 11/12/2023 0817   BUN 8 11/12/2023 0817   BUN 12 02/25/2023 0808   CREATININE 0.76 11/12/2023 0817   CALCIUM 9.0 11/12/2023 0817   GFRNONAA >60 01/22/2023 1238   GFRAA 132 04/18/2020 0926    Lipid Panel     Component Value Date/Time   CHOL 165 11/12/2023 0817   CHOL 236 (H) 02/25/2023 0808   TRIG 94 11/12/2023 0817   HDL 33 (L) 11/12/2023 0817   HDL 40 02/25/2023 0808   CHOLHDL 5.0 (H) 11/12/2023 0817   VLDL 23.8 04/30/2022 0831   LDLCALC 112 (H)  11/12/2023 0817    CBC    Component Value Date/Time   WBC 5.4 11/12/2023 0817  RBC 4.32 11/12/2023 0817   HGB 12.5 11/12/2023 0817   HGB 13.3 02/25/2023 0808   HCT 37.6 11/12/2023 0817   HCT 38.2 02/25/2023 0808   PLT 244 11/12/2023 0817   PLT 305 02/25/2023 0808   MCV 87.0 11/12/2023 0817   MCV 86 02/25/2023 0808   MCV 83 11/12/2013 1927   MCH 28.9 11/12/2023 0817   MCHC 33.2 11/12/2023 0817   RDW 12.7 11/12/2023 0817   RDW 13.1 02/25/2023 0808   RDW 14.0 11/12/2013 1927   LYMPHSABS 1.9 02/25/2023 0808   LYMPHSABS 1.5 09/22/2013 0202   MONOABS 0.7 01/22/2023 1222   MONOABS 0.7 09/22/2013 0202   EOSABS 0.2 02/25/2023 0808   EOSABS 0.1 09/22/2013 0202   BASOSABS 0.0 02/25/2023 0808   BASOSABS 0.0 09/22/2013 0202    Hgb A1C Lab Results  Component Value Date   HGBA1C 5.4 11/12/2023            Assessment & Plan:  Assessment and Plan    Folliculitis of the axilla Acute folliculitis likely due to ingrown hair and bacterial infection from dry shaving. Lymphadenopathy ruled out. - Prescribed mupirocin ointment twice daily to affected area. - Instructed to report signs of worsening: increased size, redness, swelling, drainage.        RTC in 2 months for followup of chronic conditions Helayne Lo, NP

## 2024-04-09 ENCOUNTER — Encounter: Payer: Self-pay | Admitting: Internal Medicine

## 2024-04-11 ENCOUNTER — Encounter: Payer: Self-pay | Admitting: Internal Medicine

## 2024-04-11 ENCOUNTER — Other Ambulatory Visit: Payer: Self-pay

## 2024-04-11 ENCOUNTER — Emergency Department
Admission: EM | Admit: 2024-04-11 | Discharge: 2024-04-11 | Disposition: A | Discharge status: Home / Self Care | Attending: Emergency Medicine | Admitting: Emergency Medicine

## 2024-04-11 DIAGNOSIS — Z23 Encounter for immunization: Secondary | ICD-10-CM | POA: Insufficient documentation

## 2024-04-11 DIAGNOSIS — J45901 Unspecified asthma with (acute) exacerbation: Secondary | ICD-10-CM | POA: Insufficient documentation

## 2024-04-11 DIAGNOSIS — S80811A Abrasion, right lower leg, initial encounter: Secondary | ICD-10-CM | POA: Diagnosis not present

## 2024-04-11 DIAGNOSIS — S40811A Abrasion of right upper arm, initial encounter: Secondary | ICD-10-CM | POA: Diagnosis present

## 2024-04-11 DIAGNOSIS — W540XXA Bitten by dog, initial encounter: Secondary | ICD-10-CM | POA: Diagnosis not present

## 2024-04-11 MED ORDER — HYDROCODONE-ACETAMINOPHEN 5-325 MG PO TABS: 1.0000 | ORAL_TABLET | Freq: Four times a day (QID) | ORAL | 0 refills | Status: DC | PRN

## 2024-04-11 MED ORDER — TETANUS-DIPHTHERIA TOXOIDS TD 5-2 LFU IM INJ
0.5000 mL | INJECTION | Freq: Once | INTRAMUSCULAR | Status: AC
  Administered 2024-04-11: 0.5 mL via INTRAMUSCULAR
  Filled 2024-04-11 (×2): qty 0.5

## 2024-04-11 MED ORDER — AMOXICILLIN-POT CLAVULANATE 875-125 MG PO TABS: 1.0000 | ORAL_TABLET | Freq: Two times a day (BID) | ORAL | 0 refills | Status: DC

## 2024-04-11 MED ORDER — OXYCODONE-ACETAMINOPHEN 5-325 MG PO TABS
1.0000 | ORAL_TABLET | Freq: Once | ORAL | Status: AC
  Administered 2024-04-11: 1 via ORAL
  Filled 2024-04-11: qty 1

## 2024-04-11 MED ORDER — LIDOCAINE-EPINEPHRINE-TETRACAINE (LET) TOPICAL GEL
3.0000 mL | Freq: Once | TOPICAL | Status: AC
  Administered 2024-04-11: 3 mL via TOPICAL
  Filled 2024-04-11: qty 3

## 2024-04-11 NOTE — ED Provider Notes (Signed)
 [facility] Provider Note    Event Date/Time   First MD Initiated Contact with Patient 04/11/24 [phone]     (approximate)   History   Animal Bite   HPI  Carolyn Robles is a 38 y.o. female presents to the ED with complaint of dog bite x 2.  Patient was attacked by 2 dogs at a client's house this morning with bites to her right forearm and right thigh.  Last Tdap was 8 years ago.  Patient was told that the dog that bit her was up-to-date on rabies vaccination.  There was another dog present but she is uncertain after she was knocked to the ground whether the second dog actually bit her as well.  Patient has a history of Psoriatic arthritis and seasonal asthma.     Physical Exam   Triage Vital Signs: ED Triage Vitals [04/11/24 0916]  Encounter Vitals Group     BP (!) 130/94     Girls Systolic BP Percentile      Girls Diastolic BP Percentile      Boys Systolic BP Percentile      Boys Diastolic BP Percentile      Pulse Rate 81     Resp 18     Temp 98.2 F (36.8 C)     Temp Source Oral     SpO2 100 %     Weight 150 lb (68 kg)     Height 5' 6 (1.676 m)     Head Circumference      Peak Flow      Pain Score 10     Pain Loc      Pain Education      Exclude from Growth Chart     Most recent vital signs: Vitals:   04/11/24 0916  BP: (!) 130/94  Pulse: 81  Resp: 18  Temp: 98.2 F (36.8 C)  SpO2: 100%     General: Awake, no distress. Tearful. CV:  Good peripheral perfusion.  Resp:  Normal effort.  Abd:  No distention.  Other:  Multiple superficial puncture wounds with some abrasions noted to the right upper extremity and right lower extremity.  There is 1 single puncture to the medial aspect of the right thigh.  No active bleeding.  No foreign body visible.  Right upper extremity the majority of her wounds are on the dorsum of the hand and wrist area.  There is 1 superficial abrasion noted to the left upper extremity which appears to be more  from trauma hitting the ground and a dog bite.   ED Results / Procedures / Treatments   Labs (all labs ordered are listed, but only abnormal results are displayed) Labs Reviewed - No data to display    PROCEDURES:  Critical Care performed:   Procedures   MEDICATIONS ORDERED IN ED: Medications  oxyCODONE -acetaminophen  (PERCOCET/ROXICET) 5-325 MG per tablet 1 tablet (1 tablet Oral Given 04/11/24 0953)  lidocaine -EPINEPHrine -tetracaine (LET) topical gel (3 mLs Topical Given by Other 04/11/24 1016)  tetanus & diphtheria toxoids (adult) (TENIVAC) injection 0.5 mL (0.5 mLs Intramuscular Given 04/11/24 1132)     IMPRESSION / MDM / ASSESSMENT AND PLAN / ED COURSE  I reviewed the triage vital signs and the nursing notes.   Differential diagnosis includes, but is not limited to, dog bite, tetanus immunization, infection, need for rabies prophylaxis.  38 year old female presents to the ED after being bitten by a dog at a client's residence.  [facility] animal control  was called and the officer was given a different story by the resident at that address.  The plan currently is for him to quarantine the dog and will get back with the patient as to whether she needs to proceed with rabies immunization prophylaxis.  Areas were cleaned with normal saline and dressings applied.  A prescription for Augmentin  875 and hydrocodone  was sent to her pharmacy.  She is aware that she needs to take the entire course of antibiotics and to clean the areas daily with mild soap and water and watch for any signs of infection.  She is return to the emergency department if any concerns for infection and also if rabies prophylaxis immunizations are recommended by animal control.      Patient's presentation is most consistent with acute illness / injury with system symptoms.  FINAL CLINICAL IMPRESSION(S) / ED DIAGNOSES   Final diagnoses:  Dog bite, initial encounter     Rx / DC Orders   ED Discharge  Orders          Ordered    amoxicillin -clavulanate (AUGMENTIN ) 875-125 MG tablet  2 times daily        04/11/24 1134    HYDROcodone -acetaminophen  (NORCO/VICODIN) 5-325 MG tablet  Every 6 hours PRN        04/11/24 1134             Note:  This document was prepared using Dragon voice recognition software and may include unintentional dictation errors.   Saunders Shona CROME, PA-C 04/11/24 1417    Dorothyann Drivers, MD 04/11/24 1506

## 2024-04-11 NOTE — ED Triage Notes (Addendum)
 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 Pt is reaching out for vaccinations records at this time due to not knowing dogs. Pt tearful and shaking in triage.   Pt has puncture wounds to R hand and R leg.

## 2024-04-11 NOTE — Discharge Instructions (Signed)
 Return to the emergency department if any signs of infection also if animal control directs you to return for rabies injections. Take antibiotics until completely finished.  Clean the areas daily with mild soap and water and allowed to get air.  Remove the Steri-Strip if this area looks like it is getting infected so that it may drain.  Pain medication is to be taken only when you are not driving or operating machinery.

## 2024-04-14 ENCOUNTER — Encounter: Payer: Self-pay | Admitting: Internal Medicine

## 2024-04-14 ENCOUNTER — Ambulatory Visit: Admitting: Internal Medicine

## 2024-04-14 VITALS — BP 130/70 | Ht 66.0 in | Wt 147.6 lb

## 2024-04-14 DIAGNOSIS — S41151D Open bite of right upper arm, subsequent encounter: Secondary | ICD-10-CM

## 2024-04-14 DIAGNOSIS — L700 Acne vulgaris: Secondary | ICD-10-CM

## 2024-04-14 DIAGNOSIS — J45998 Other asthma: Secondary | ICD-10-CM | POA: Diagnosis not present

## 2024-04-14 DIAGNOSIS — L4050 Arthropathic psoriasis, unspecified: Secondary | ICD-10-CM

## 2024-04-14 DIAGNOSIS — F418 Other specified anxiety disorders: Secondary | ICD-10-CM

## 2024-04-14 DIAGNOSIS — E7800 Pure hypercholesterolemia, unspecified: Secondary | ICD-10-CM

## 2024-04-14 DIAGNOSIS — W540XXD Bitten by dog, subsequent encounter: Secondary | ICD-10-CM

## 2024-04-14 DIAGNOSIS — S71151D Open bite, right thigh, subsequent encounter: Secondary | ICD-10-CM

## 2024-04-14 NOTE — Assessment & Plan Note (Signed)
Encourage low-fat diet

## 2024-04-14 NOTE — Patient Instructions (Signed)
Animal Bite, Adult Animal bites can be mild or serious. Small bites from house pets normally are mild. Bites from cats, strays, or wild animals can be serious. If a stray or wild animal bites you, you need to get medical help right away. You may also need a shot to prevent rabies infection. What increases the risk? Being near pets you do not know. Being near animals that are eating, sleeping, or caring for their babies. Being outside where small, wild animals move freely. What are the signs or symptoms? Pain. Bleeding. Swelling. Bruising. How is this treated? Treatment may include: Cleaning your wound. Rinsing out (flushing) your wound. This uses saline solution, which is made of salt and water. Putting a bandage on your wound. Closing your wound with stitches (sutures), staples, skin glue, or skin tape (adhesive strips). Antibiotic medicine. You may be given pills, cream, gel, or fluid through an IV. A tetanus shot. Rabies treatment, if the animal could have rabies. Surgery, if there is infection or damage that needs to be fixed. Follow these instructions at home: Medicines Take or apply over-the-counter and prescription medicines only as told by your doctor. If you were prescribed an antibiotic medicine, take or apply it as told by your doctor. Do not stop using it even if your wound gets better. Wound care  Follow instructions from your doctor about how to take care of your wound. Make sure you: Wash your hands with soap and water for at least 20 seconds before and after you change your bandage. If you cannot use soap and water, use hand sanitizer. Change your bandage. Leave stitches or skin glue in place for at least 2 weeks. Leave tape strips alone unless you are told to take them off. You may trim the edges of the tape strips if they curl up. Check your wound every day for signs of infection. Check for: More redness, swelling, or pain. More fluid or blood. Warmth. Pus or a  bad smell. General instructions  Raise (elevate) the injured area above the level of your heart while you are sitting or lying down. If told, put ice on the injured area. To do this: Put ice in a plastic bag. Place a towel between your skin and the bag. Leave the ice on for 20 minutes, 2-3 times per day. Take off the ice if your skin turns bright red. This is very important. If you cannot feel pain, heat, or cold, you have a greater risk of damage to the area. Keep all follow-up visits. Contact a doctor if: You have more redness, swelling, or pain around your wound. Your wound feels warm to the touch. You have a fever or chills. You have a general feeling of sickness (malaise). You feel like you may vomit. You vomit. You have pain that does not get better. Get help right away if: You have a red streak going away from your wound. You have any of these coming from your wound: Non-clear fluid. More blood. Pus or a bad smell. You have trouble moving your injured area. You lose feeling (have numbness) or feel tingling anywhere on your body. Summary Animal bites can be mild or serious. If a stray or wild animal bites you, you need to get medical help right away. Your doctor will look at the wound and may ask about how the animal bite happened. Treatment may include wound care, antibiotic medicine, a tetanus shot, and rabies treatment. This information is not intended to replace advice given to you   by your health care provider. Make sure you discuss any questions you have with your health care provider. Document Revised: 10/05/2021 Document Reviewed: 10/05/2021 Elsevier Patient Education  2024 [employer]vier Inc.  

## 2024-04-14 NOTE — Assessment & Plan Note (Signed)
 Managed with alprazolam  0.25 mg daily as needed Support offered

## 2024-04-14 NOTE — Assessment & Plan Note (Signed)
 Not medicated at this time We will monitor

## 2024-04-14 NOTE — Assessment & Plan Note (Signed)
 Continue clindamycin  1% gel and retin-a  per dermatology
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
## 2024-04-14 NOTE — Assessment & Plan Note (Signed)
 Continue flonase , xyzal  5 mg, montelukast  10 mg and albuterol  as needed

## 2024-04-22 ENCOUNTER — Encounter: Payer: Self-pay | Admitting: Emergency Medicine

## 2024-05-12 ENCOUNTER — Ambulatory Visit: Payer: Self-pay | Admitting: Internal Medicine

## 2024-07-06 ENCOUNTER — Other Ambulatory Visit: Payer: Self-pay | Admitting: Internal Medicine

## 2024-07-06 DIAGNOSIS — J309 Allergic rhinitis, unspecified: Secondary | ICD-10-CM

## 2024-07-06 NOTE — Telephone Encounter (Signed)
 Requested Prescriptions  Pending Prescriptions Disp Refills   montelukast  (SINGULAIR ) 10 MG tablet [Pharmacy Med Name: MONTELUKAST  SOD 10 MG TABLET] 90 tablet 1    Sig: TAKE 1 TABLET BY MOUTH EVERYDAY AT BEDTIME     Pulmonology:  Leukotriene Inhibitors Passed - 07/06/2024  5:26 PM      Passed - Valid encounter within last 12 months    Recent Outpatient Visits           2 months ago Seasonal asthma   [place] [facility] [place], Angeline ORN, NP   4 months ago Folliculitis of axilla   [place] [facility] [place], [place] W, NP               levocetirizine (XYZAL ) 5 MG tablet [Pharmacy Med Name: LEVOCETIRIZINE 5 MG TABLET] 90 tablet 1    Sig: TAKE 1 TABLET (5 MG TOTAL) BY MOUTH DAILY.     Ear, Nose, and Throat:  Antihistamines - levocetirizine dihydrochloride  Passed - 07/06/2024  5:26 PM      Passed - Cr in normal range and within 360 days    Creat  Date Value Ref Range Status  11/12/2023 0.76 0.50 - 0.97 mg/dL Final         Passed - eGFR is 10 or above and within 360 days    GFR calc Af Amer  Date Value Ref Range Status  04/18/2020 132 >59 mL/min/1.73 Final    Comment:    **[facility] currently reports eGFR in compliance with the current**   recommendations of the [employer]. [facility] will   update reporting as new guidelines are published from the NKF-ASN   Task force.    GFR, Estimated  Date Value Ref Range Status  01/22/2023 >60 >60 mL/min Final    Comment:    (NOTE) Calculated using the CKD-EPI Creatinine Equation (2021)    GFR  Date Value Ref Range Status  04/30/2022 112.34 >60.00 mL/min Final    Comment:    Calculated using the CKD-EPI Creatinine Equation (2021)   eGFR  Date Value Ref Range Status  11/12/2023 103 > OR = 60 mL/min/1.56m2 Final  02/25/2023 115 >59 mL/min/1.73 Final         Passed - Valid encounter within last 12 months    Recent Outpatient Visits           2 months ago Seasonal asthma    [place] [facility] [place], Angeline ORN, NP   4 months ago Folliculitis of axilla   [place] [facility] [place], Angeline ORN, [place]

## 2024-08-19 ENCOUNTER — Telehealth: Admitting: Physician Assistant

## 2024-08-19 DIAGNOSIS — J020 Streptococcal pharyngitis: Secondary | ICD-10-CM

## 2024-08-19 MED ORDER — AMOXICILLIN 500 MG PO CAPS: 500.0000 mg | ORAL_CAPSULE | Freq: Two times a day (BID) | ORAL | 0 refills | Status: AC

## 2024-08-19 NOTE — Progress Notes (Signed)

## 2024-08-23 ENCOUNTER — Other Ambulatory Visit: Payer: Self-pay

## 2024-08-23 ENCOUNTER — Encounter: Payer: Self-pay | Admitting: Internal Medicine

## 2024-08-23 MED ORDER — MUPIROCIN 2 % EX OINT: 1.0000 | TOPICAL_OINTMENT | Freq: Two times a day (BID) | CUTANEOUS | 0 refills | Status: AC

## 2024-08-23 MED ORDER — CLINDAMYCIN PHOS (TWICE-DAILY) 1 % EX GEL: Freq: Two times a day (BID) | CUTANEOUS | 2 refills | Status: DC

## 2024-08-23 NOTE — Addendum Note (Signed)
 Addended by: ZELIA GAUZE D on: 08/23/2024 11:55 AM   Modules accepted: Orders

## 2024-08-23 NOTE — Addendum Note (Signed)
 Addended by: ANTONETTE ANGELINE ORN on: 08/23/2024 12:06 PM   Modules accepted: Orders

## 2024-09-15 ENCOUNTER — Other Ambulatory Visit: Payer: Self-pay | Admitting: Internal Medicine

## 2024-09-15 MED ORDER — ZEPBOUND 7.5 MG/0.5ML ~~LOC~~ SOAJ: 7.5000 mg | SUBCUTANEOUS | 0 refills | Status: DC

## 2024-09-17 ENCOUNTER — Encounter: Payer: Self-pay | Admitting: Internal Medicine

## 2024-09-17 MED ORDER — ZEPBOUND 5 MG/0.5ML ~~LOC~~ SOAJ: 5.0000 mg | SUBCUTANEOUS | 0 refills | Status: DC

## 2024-09-17 NOTE — Telephone Encounter (Signed)
 Requested medications are due for refill today.  unsure  Requested medications are on the active medications list.  yes  Last refill.   Future visit scheduled.   yes  Notes to clinic.  Provider has already placed orders for increasing dosage. Please review.    Requested Prescriptions  Pending Prescriptions Disp Refills   ZEPBOUND  2.5 MG/0.5ML Pen [Pharmacy Med Name: ZEPBOUND  2.5 MG/0.5 ML PEN]      Sig: INJECT 2.5 MG SUBCUTANEOUSLY WEEKLY     Off-Protocol Failed - 09/17/2024  5:34 PM      Failed - Medication not assigned to a protocol, review manually.      Passed - Valid encounter within last 12 months    Recent Outpatient Visits           5 months ago Seasonal asthma   [place] [facility] [place], Angeline ORN, NP   7 months ago Folliculitis of axilla   [place] [facility] [place], Angeline ORN, [place]

## 2024-09-23 ENCOUNTER — Encounter: Payer: Self-pay | Admitting: Internal Medicine

## 2024-09-23 MED ORDER — CLINDAMYCIN PHOS-BENZOYL PEROX 1-5 % EX GEL: Freq: Two times a day (BID) | CUTANEOUS | 2 refills | Status: AC

## 2024-10-02 ENCOUNTER — Telehealth: Admitting: Physician Assistant

## 2024-10-02 DIAGNOSIS — J069 Acute upper respiratory infection, unspecified: Secondary | ICD-10-CM

## 2024-10-02 MED ORDER — FLUTICASONE PROPIONATE 50 MCG/ACT NA SUSP: 2.0000 | Freq: Every day | NASAL | 6 refills | Status: AC

## 2024-10-02 MED ORDER — FLUTICASONE PROPIONATE 50 MCG/ACT NA SUSP: 2.0000 | Freq: Every day | NASAL | 11 refills | Status: DC | PRN

## 2024-10-02 MED ORDER — BENZONATATE 100 MG PO CAPS: 100.0000 mg | ORAL_CAPSULE | Freq: Two times a day (BID) | ORAL | 0 refills | Status: DC | PRN

## 2024-10-02 MED ORDER — AMOXICILLIN-POT CLAVULANATE 875-125 MG PO TABS: 1.0000 | ORAL_TABLET | Freq: Two times a day (BID) | ORAL | 0 refills | Status: AC

## 2024-10-02 MED ORDER — LIDOCAINE VISCOUS HCL 2 % MT SOLN: OROMUCOSAL | 0 refills | Status: DC

## 2024-10-02 MED ORDER — PSEUDOEPH-BROMPHEN-DM 30-2-10 MG/5ML PO SYRP: 5.0000 mL | ORAL_SOLUTION | Freq: Four times a day (QID) | ORAL | 0 refills | Status: AC | PRN

## 2024-10-02 NOTE — Addendum Note (Signed)
 Addended byBETHA ROLAN BERTHOLD on: 10/02/2024 11:27 AM   Modules accepted: Orders

## 2024-10-02 NOTE — Progress Notes (Signed)
 Patient with concern regarding strep exposure. Additional medication sent to the pharmacy. See messages for details.

## 2024-10-02 NOTE — Progress Notes (Signed)

## 2024-11-06 ENCOUNTER — Other Ambulatory Visit: Payer: Self-pay | Admitting: Medical Genetics

## 2024-11-12 ENCOUNTER — Ambulatory Visit (INDEPENDENT_AMBULATORY_CARE_PROVIDER_SITE_OTHER): Admitting: Internal Medicine

## 2024-11-12 VITALS — BP 110/64 | Ht 66.0 in | Wt 147.4 lb

## 2024-11-12 DIAGNOSIS — Z0000 Encounter for general adult medical examination without abnormal findings: Secondary | ICD-10-CM

## 2024-11-12 MED ORDER — TIRZEPATIDE-WEIGHT MANAGEMENT 2.5 MG/0.5ML ~~LOC~~ SOLN: 2.5000 mg | SUBCUTANEOUS | 1 refills | Status: AC

## 2024-11-12 NOTE — Progress Notes (Signed)
 "  Subjective:    Patient ID: Carolyn Robles, female    DOB: Sep 08, 1986, 39 y.o.   MRN: [id number]  HPI  Patient presents to clinic today for her annual exam.  Flu: 06/2019 Tetanus: 03/2024 COVID: never Pap smear: 04/2021 Dentist: biannually  Diet: She does eat meat. She consumes fruits and veggies. She tries to avoid fried foods. She drinks mostly carbonated water, water, orange juice. Exercise: None  Review of Systems   Past Medical History:  Diagnosis Date   Allergy    pollen, trees, etc    Anxiety 04/30/2022   Asthma    seasonal   C. difficile diarrhea    Confusion 08/24/2019   COVID-19    05/08/20 trouble taste and smell   FH: heart attack 04/14/2020   History of colposcopy    CIN 3 HPV +   Tick bite of left hip    03/2020   UTI (urinary tract infection)     Current Outpatient Medications  Medication Sig Dispense Refill   albuterol  (VENTOLIN  HFA) 108 (90 Base) MCG/ACT inhaler INHALE 1 TO 2 PUFFS INTO THE LUNGS EVERY 6 (SIX) HOURS AS NEEDED FOR WHEEZING OR SHORTNESS OF BREATH. 18 g 12   benzonatate  (TESSALON ) 100 MG capsule Take 1 capsule (100 mg total) by mouth 2 (two) times daily as needed for cough. 20 capsule 0   clindamycin -benzoyl peroxide (BENZACLIN) gel Apply topically 2 (two) times daily. 25 g 2   clobetasol  ointment (TEMOVATE ) 0.05 % Apply 1 Application topically 2 (two) times daily. 30 g 1   Cyanocobalamin  (VITAMIN B12 PO) Take by mouth.     fluticasone  (FLONASE ) 50 MCG/ACT nasal spray Place 2 sprays into both nostrils daily. 16 g 6   fluticasone  (FLONASE ) 50 MCG/ACT nasal spray Place 2 sprays into both nostrils daily as needed for allergies or rhinitis. 16 g 11   HYDROcodone -acetaminophen  (NORCO/VICODIN) 5-325 MG tablet Take 1 tablet by mouth every 6 (six) hours as needed. 15 tablet 0   levocetirizine (XYZAL ) 5 MG tablet TAKE 1 TABLET (5 MG TOTAL) BY MOUTH DAILY. 90 tablet 1   lidocaine  (XYLOCAINE ) 2 % solution Gargle 2-3 Mls every 6 hours as needed for  mouth pain. 20 mL 0   montelukast  (SINGULAIR ) 10 MG tablet TAKE 1 TABLET BY MOUTH EVERYDAY AT BEDTIME 90 tablet 1   mupirocin  ointment (BACTROBAN ) 2 % Apply 1 Application topically 2 (two) times daily. 22 g 0   tirzepatide  (ZEPBOUND ) 5 MG/0.5ML Pen Inject 5 mg into the skin once a week. 6 mL 0   tirzepatide  (ZEPBOUND ) 7.5 MG/0.5ML Pen Inject 7.5 mg into the skin once a week. 6 mL 0   tretinoin  (RETIN-A ) 0.025 % cream Apply topically at bedtime. With moisturizer 45 g 11   No current facility-administered medications for this visit.    Allergies  Allergen Reactions   Apple    Apple Juice     Or fresh fruit throat itching    Doxycycline    Spironolactone  Photosensitivity    Family History  Problem Relation Age of Onset   Heart disease Mother        MI died when pt was young; mom died when was 50 and mom was adopted    Diabetes Father    Depression Sister    Miscarriages / Stillbirths Sister    Alcohol abuse Brother    Depression Brother    Drug abuse Brother     Social History   Socioeconomic History   Marital status:  Married    Spouse name: Not on file   Number of children: Not on file   Years of education: Not on file   Highest education level: Some college, no degree  Occupational History   Not on file  Tobacco Use   Smoking status: Never   Smokeless tobacco: Never  Vaping Use   Vaping status: Never Used  Substance and Sexual Activity   Alcohol use: Yes    Alcohol/week: 2.0 standard drinks of alcohol    Types: 2 Cans of beer per week   Drug use: No   Sexual activity: Yes    Birth control/protection: None  Other Topics Concern   Not on file  Social History Narrative   College ed    Receptionist at [employer]    Married    2 kids    Grew up in [place] with siblings   Social Drivers of Health   Tobacco Use: Low Risk (04/14/2024)   Patient History    Smoking Tobacco Use: Never    Smokeless Tobacco Use: Never    Passive Exposure: Not on file  Financial  Resource Strain: Low Risk (11/12/2024)   Overall Financial Resource Strain (CARDIA)    Difficulty of Paying Living Expenses: Not very hard  Food Insecurity: Food Insecurity Present (11/12/2024)   Epic    Worried About Programme Researcher, Broadcasting/film/video in the Last Year: Sometimes true    Ran Out of Food in the Last Year: Never true  Transportation Needs: No Transportation Needs (11/12/2024)   Epic    Lack of Transportation (Medical): No    Lack of Transportation (Non-Medical): No  Physical Activity: Inactive (11/12/2024)   Exercise Vital Sign    Days of Exercise per Week: 1 day    Minutes of Exercise per Session: 0 min  Stress: Stress Concern Present (11/12/2024)   [employer] of Occupational Health - Occupational Stress Questionnaire    Feeling of Stress: Very much  Social Connections: Socially Integrated (11/12/2024)   Social Connection and Isolation Panel    Frequency of Communication with Friends and Family: Twice a week    Frequency of Social Gatherings with Friends and Family: Once a week    Attends Religious Services: More than 4 times per year    Active Member of Clubs or Organizations: Yes    Attends Banker Meetings: More than 4 times per year    Marital Status: Married  Catering Manager Violence: Not on file  Depression (PHQ2-9): Low Risk (04/14/2024)   Depression (PHQ2-9)    PHQ-2 Score: 4  Alcohol Screen: Low Risk (11/12/2024)   Alcohol Screen    Last Alcohol Screening Score (AUDIT): 3  Housing: Unknown (11/12/2024)   Epic    Unable to Pay for Housing in the Last Year: No    Number of Times Moved in the Last Year: Not on file    Homeless in the Last Year: No  Utilities: Not on file  Health Literacy: Not on file     Constitutional: Denies fever, malaise, fatigue, headache or abrupt weight changes.  HEENT: Denies eye pain, eye redness, ear pain, ringing in the ears, wax buildup, runny nose, nasal congestion, bloody nose, or sore throat. Respiratory: Denies difficulty  breathing, shortness of breath, cough or sputum production.   Cardiovascular: Denies chest pain, chest tightness, palpitations or swelling in the hands or feet.  Gastrointestinal: Denies abdominal pain, bloating, constipation, diarrhea or blood in the stool.  GU: Denies urgency, frequency, pain with urination, burning  sensation, blood in urine, odor or discharge. Musculoskeletal: Patient reports joint pain.  Denies decrease in range of motion, difficulty with gait, muscle pain or joint swelling.  Skin: Patient reports mass of chin.  Denies redness, rashes, or ulcercations.  Neurological: Denies dizziness, difficulty with memory, difficulty with speech or problems with balance and coordination.  Psych: Patient has a history of anxiety.  Denies depression, SI/HI.  No other specific complaints in a complete review of systems (except as listed in HPI above).      Objective:   Physical Exam BP 110/64 (BP Location: Right Arm, Patient Position: Sitting, Cuff Size: Normal)   Ht 5' 6 (1.676 m)   Wt 147 lb 6.4 oz (66.9 kg)   LMP 11/05/2024 (Approximate)   BMI 23.79 kg/m    Wt Readings from Last 3 Encounters:  04/14/24 147 lb 9.6 oz (67 kg)  04/11/24 150 lb (68 kg)  02/19/24 154 lb 6.4 oz (70 kg)    General: Appears her stated age, overweight, in NAD. Skin: Warm, dry and intact. 1 cm sebaceous cyst noted of the left chin. HEENT: Head: normal shape and size; Eyes: sclera white, no icterus, conjunctiva pink, PERRLA and EOMs intact;   Neck:  Neck supple, trachea midline. No masses, lumps or thyromegaly present.  Cardiovascular: Normal rate and rhythm. S1,S2 noted.  No murmur, rubs or gallops noted. No JVD or BLE edema.  Pulmonary/Chest: Normal effort and positive vesicular breath sounds. No respiratory distress. No wheezes, rales or ronchi noted.  Abdomen: Soft and nontender. Normal bowel sounds. No distention or masses noted. Musculoskeletal: Strength 5/5 BUE/BLE. No difficulty with gait.   Neurological: Alert and oriented. Cranial nerves II-XII grossly intact. Coordination normal.  Psychiatric: Mood and affect normal. Behavior is normal. Judgment and thought content normal.    BMET    Component Value Date/Time   NA 139 11/12/2023 0817   NA 139 02/25/2023 0808   K 4.3 11/12/2023 0817   CL 104 11/12/2023 0817   CO2 29 11/12/2023 0817   GLUCOSE 85 11/12/2023 0817   BUN 8 11/12/2023 0817   BUN 12 02/25/2023 0808   CREATININE 0.76 11/12/2023 0817   CALCIUM 9.0 11/12/2023 0817   GFRNONAA >60 01/22/2023 1238   GFRAA 132 04/18/2020 0926    Lipid Panel     Component Value Date/Time   CHOL 165 11/12/2023 0817   CHOL 236 (H) 02/25/2023 0808   TRIG 94 11/12/2023 0817   HDL 33 (L) 11/12/2023 0817   HDL 40 02/25/2023 0808   CHOLHDL 5.0 (H) 11/12/2023 0817   VLDL 23.8 04/30/2022 0831   LDLCALC 112 (H) 11/12/2023 0817    CBC    Component Value Date/Time   WBC 5.4 11/12/2023 0817   RBC 4.32 11/12/2023 0817   HGB 12.5 11/12/2023 0817   HGB 13.3 02/25/2023 0808   HCT 37.6 11/12/2023 0817   HCT 38.2 02/25/2023 0808   PLT 244 11/12/2023 0817   PLT 305 02/25/2023 0808   MCV 87.0 11/12/2023 0817   MCV 86 02/25/2023 0808   MCV 83 11/12/2013 1927   MCH 28.9 11/12/2023 0817   MCHC 33.2 11/12/2023 0817   RDW 12.7 11/12/2023 0817   RDW 13.1 02/25/2023 0808   RDW 14.0 11/12/2013 1927   LYMPHSABS 1.9 02/25/2023 0808   LYMPHSABS 1.5 09/22/2013 0202   MONOABS 0.7 01/22/2023 1222   MONOABS 0.7 09/22/2013 0202   EOSABS 0.2 02/25/2023 0808   EOSABS 0.1 09/22/2013 0202   BASOSABS 0.0 02/25/2023 9191  BASOSABS 0.0 09/22/2013 0202    Hgb A1C Lab Results  Component Value Date   HGBA1C 5.4 11/12/2023            Assessment & Plan:   Preventative health maintenance:  Flu shot declined Tetanus UTD Encouraged her to get her COVID-vaccine Pap smear UTD Encouraged her to consume a balanced diet and exercise regimen Advised her to see a dentist annually We will  check CBC, c-Met, lipid, A1c today  RTC in 6 months, follow-up chronic conditions Angeline Laura, NP  "

## 2024-11-12 NOTE — Patient Instructions (Signed)
 Health Maintenance, Female Adopting a healthy lifestyle and getting preventive care are important in promoting health and wellness. Ask your health care provider about: The right schedule for you to have regular tests and exams. Things you can do on your own to prevent diseases and keep yourself healthy. What should I know about diet, weight, and exercise? Eat a healthy diet  Eat a diet that includes plenty of vegetables, fruits, low-fat dairy products, and lean protein. Do not eat a lot of foods that are high in solid fats, added sugars, or sodium. Maintain a healthy weight Body mass index (BMI) is used to identify weight problems. It estimates body fat based on height and weight. Your health care provider can help determine your BMI and help you achieve or maintain a healthy weight. Get regular exercise Get regular exercise. This is one of the most important things you can do for your health. Most adults should: Exercise for at least 150 minutes each week. The exercise should increase your heart rate and make you sweat (moderate-intensity exercise). Do strengthening exercises at least twice a week. This is in addition to the moderate-intensity exercise. Spend less time sitting. Even light physical activity can be beneficial. Watch cholesterol and blood lipids Have your blood tested for lipids and cholesterol at 38 years of age, then have this test every 5 years. Have your cholesterol levels checked more often if: Your lipid or cholesterol levels are high. You are older than 38 years of age. You are at high risk for heart disease. What should I know about cancer screening? Depending on your health history and family history, you may need to have cancer screening at various ages. This may include screening for: Breast cancer. Cervical cancer. Colorectal cancer. Skin cancer. Lung cancer. What should I know about heart disease, diabetes, and high blood pressure? Blood pressure and heart  disease High blood pressure causes heart disease and increases the risk of stroke. This is more likely to develop in people who have high blood pressure readings or are overweight. Have your blood pressure checked: Every 3-5 years if you are 32-37 years of age. Every year if you are 71 years old or older. Diabetes Have regular diabetes screenings. This checks your fasting blood sugar level. Have the screening done: Once every three years after age 24 if you are at a normal weight and have a low risk for diabetes. More often and at a younger age if you are overweight or have a high risk for diabetes. What should I know about preventing infection? Hepatitis B If you have a higher risk for hepatitis B, you should be screened for this virus. Talk with your health care provider to find out if you are at risk for hepatitis B infection. Hepatitis C Testing is recommended for: Everyone born from [birth year] through 1964. Anyone with known risk factors for hepatitis C. Sexually transmitted infections (STIs) Get screened for STIs, including gonorrhea and chlamydia, if: You are sexually active and are younger than 38 years of age. You are older than 38 years of age and your health care provider tells you that you are at risk for this type of infection. Your sexual activity has changed since you were last screened, and you are at increased risk for chlamydia or gonorrhea. Ask your health care provider if you are at risk. Ask your health care provider about whether you are at high risk for HIV. Your health care provider may recommend a prescription medicine to help prevent HIV  infection. If you choose to take medicine to prevent HIV, you should first get tested for HIV. You should then be tested every 3 months for as long as you are taking the medicine. Pregnancy If you are about to stop having your period (premenopausal) and you may become pregnant, seek counseling before you get pregnant. Take 400 to 800  micrograms (mcg) of folic acid every day if you become pregnant. Ask for birth control (contraception) if you want to prevent pregnancy. Osteoporosis and menopause Osteoporosis is a disease in which the bones lose minerals and strength with aging. This can result in bone fractures. If you are 42 years old or older, or if you are at risk for osteoporosis and fractures, ask your health care provider if you should: Be screened for bone loss. Take a calcium  or vitamin D  supplement to lower your risk of fractures. Be given hormone replacement therapy (HRT) to treat symptoms of menopause. Follow these instructions at home: Alcohol use Do not drink alcohol if: Your health care provider tells you not to drink. You are pregnant, may be pregnant, or are planning to become pregnant. If you drink alcohol: Limit how much you have to: 0-1 drink a day. Know how much alcohol is in your drink. In the [place] one drink equals one 12 oz bottle of beer (355 mL), one 5 oz glass of wine (148 mL), or one 1 oz glass of hard liquor (44 mL). Lifestyle Do not use any products that contain nicotine or tobacco. These products include cigarettes, chewing tobacco, and vaping devices, such as e-cigarettes. If you need help quitting, ask your health care provider. Do not use street drugs. Do not share needles. Ask your health care provider for help if you need support or information about quitting drugs. General instructions Schedule regular health, dental, and eye exams. Stay current with your vaccines. Tell your health care provider if: You often feel depressed. You have ever been abused or do not feel safe at home. This information is not intended to replace advice given to you by your health care provider. Make sure you discuss any questions you have with your health care provider. Document Revised: 04/07/2024 Document Reviewed: 02/19/2021 Elsevier Patient Education  2025 [employer].

## 2024-11-13 LAB — HEMOGLOBIN A1C
Hgb A1c MFr Bld: 5 %
Mean Plasma Glucose: 97 mg/dL
eAG (mmol/L): 5.4 mmol/L

## 2024-11-13 LAB — CBC
HCT: 38.1 % (ref 35.9–46.0)
Hemoglobin: 12.5 g/dL (ref 11.7–15.5)
MCH: 28.4 pg (ref 27.0–33.0)
MCHC: 32.8 g/dL (ref 31.6–35.4)
MCV: 86.6 fL (ref 81.4–101.7)
MPV: 9.8 fL (ref 7.5–12.5)
Platelets: 207 10*3/uL (ref 140–400)
RBC: 4.4 Million/uL (ref 3.80–5.10)
RDW: 13 % (ref 11.0–15.0)
WBC: 4.8 10*3/uL (ref 3.8–10.8)

## 2024-11-13 LAB — COMPREHENSIVE METABOLIC PANEL WITH GFR
AG Ratio: 1.9 (calc) (ref 1.0–2.5)
ALT: 11 U/L (ref 6–29)
AST: 12 U/L (ref 10–30)
Albumin: 4.3 g/dL (ref 3.6–5.1)
Alkaline phosphatase (APISO): 34 U/L (ref 31–125)
BUN: 13 mg/dL (ref 7–25)
CO2: 28 mmol/L (ref 20–32)
Calcium: 8.5 mg/dL — ABNORMAL LOW (ref 8.6–10.2)
Chloride: 103 mmol/L (ref 98–110)
Creat: 0.64 mg/dL (ref 0.50–0.97)
Globulin: 2.3 g/dL (ref 1.9–3.7)
Glucose, Bld: 84 mg/dL (ref 65–99)
Potassium: 4.1 mmol/L (ref 3.5–5.3)
Sodium: 138 mmol/L (ref 135–146)
Total Bilirubin: 0.6 mg/dL (ref 0.2–1.2)
Total Protein: 6.6 g/dL (ref 6.1–8.1)
eGFR: 116 mL/min/{1.73_m2}

## 2024-11-13 LAB — LIPID PANEL
Cholesterol: 178 mg/dL
HDL: 50 mg/dL
LDL Cholesterol (Calc): 111 mg/dL — ABNORMAL HIGH
Non-HDL Cholesterol (Calc): 128 mg/dL
Total CHOL/HDL Ratio: 3.6 (calc)
Triglycerides: 80 mg/dL

## 2024-11-15 ENCOUNTER — Ambulatory Visit: Payer: Self-pay | Admitting: Internal Medicine

## 2024-11-20 ENCOUNTER — Other Ambulatory Visit: Payer: Self-pay

## 2025-05-17 ENCOUNTER — Ambulatory Visit: Admitting: Internal Medicine
